# Patient Record
Sex: Female | Born: 1985 | Hispanic: Yes | Marital: Single | Smoking: Never smoker
Health system: Southern US, Community
[De-identification: ages and names within clinical notes are randomized; demographics above are authoritative.]

## PROBLEM LIST (undated history)

## (undated) DIAGNOSIS — Z8619 Personal history of other infectious and parasitic diseases: Secondary | ICD-10-CM

## (undated) DIAGNOSIS — T8859XA Other complications of anesthesia, initial encounter: Secondary | ICD-10-CM

## (undated) DIAGNOSIS — Z9289 Personal history of other medical treatment: Secondary | ICD-10-CM

## (undated) DIAGNOSIS — T4145XA Adverse effect of unspecified anesthetic, initial encounter: Secondary | ICD-10-CM

## (undated) HISTORY — DX: Personal history of other medical treatment: Z92.89

---
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
## 2008-08-03 HISTORY — PX: OVARY SURGERY: SHX727

## 2008-09-25 ENCOUNTER — Ambulatory Visit: Payer: Self-pay

## 2011-08-04 DIAGNOSIS — Z9289 Personal history of other medical treatment: Secondary | ICD-10-CM

## 2011-08-04 HISTORY — DX: Personal history of other medical treatment: Z92.89

## 2015-08-04 NOTE — L&D Delivery Note (Signed)
Deliver Note   Date of Delivery:   01/26/2016 Primary OB:   [facility] Gestational Age/EDD: 5045w5d by 01/28/2016, by Other Basis  Antepartum complications:  OB History    Gravida Para Term Preterm AB TAB SAB Ectopic Multiple Living   1 1 1       0 1      Delivered By:   Vena AustriaStaebler, Alycea Segoviano MD  Delivery Type:   TSVD Anesthesia:     none  Intrapartum complications:  GBS:    Negative Laceration:    1st degree Episiotomy:    none Placenta:    Spontaneous Estimated Blood Loss:  200mL Baby:    Liveborn female delivered at 2015, APGAR (1 MIN): 8   APGAR (5 MINS): 9, weight 7lbs 0 oz  Deliver Details   At 1082015 a female was delivered via  TSVD(Presentation: ROA).  APGAR:  8, 9 ; weight 7lbs 0oz   Placenta status: spontaneous, intact.  Cord: 3VC withoutcomplications: Marland Kitchen.    Mom to postpartum.  Baby to Couplet care / Skin to Skin.

## 2015-08-29 LAB — HM PAP SMEAR

## 2016-01-26 ENCOUNTER — Encounter: Payer: Self-pay | Admitting: *Deleted

## 2016-01-26 ENCOUNTER — Inpatient Hospital Stay
Admission: EM | Admit: 2016-01-26 | Discharge: 2016-01-28 | DRG: 775 | Disposition: A | Discharge status: Home / Self Care | Payer: BLUE CROSS/BLUE SHIELD | Attending: Obstetrics and Gynecology | Admitting: Obstetrics and Gynecology

## 2016-01-26 DIAGNOSIS — Z6834 Body mass index (BMI) 34.0-34.9, adult: Secondary | ICD-10-CM

## 2016-01-26 DIAGNOSIS — Z3A39 39 weeks gestation of pregnancy: Secondary | ICD-10-CM

## 2016-01-26 DIAGNOSIS — O99214 Obesity complicating childbirth: Secondary | ICD-10-CM | POA: Diagnosis present

## 2016-01-26 HISTORY — DX: Other complications of anesthesia, initial encounter: T88.59XA

## 2016-01-26 HISTORY — DX: Adverse effect of unspecified anesthetic, initial encounter: T41.45XA

## 2016-01-26 LAB — CBC
HCT: 36.8 % (ref 35.0–47.0)
HEMOGLOBIN: 12.8 g/dL (ref 12.0–16.0)
MCH: 32.1 pg (ref 26.0–34.0)
MCHC: 34.7 g/dL (ref 32.0–36.0)
MCV: 92.6 fL (ref 80.0–100.0)
Platelets: 257 10*3/uL (ref 150–440)
RBC: 3.97 MIL/uL (ref 3.80–5.20)
RDW: 13.9 % (ref 11.5–14.5)
WBC: 9.5 10*3/uL (ref 3.6–11.0)

## 2016-01-26 LAB — URINE DRUG SCREEN, QUALITATIVE (ARMC ONLY)
Amphetamines, Ur Screen: NOT DETECTED
BARBITURATES, UR SCREEN: NOT DETECTED
BENZODIAZEPINE, UR SCRN: NOT DETECTED
CANNABINOID 50 NG, UR ~~LOC~~: NOT DETECTED
Cocaine Metabolite,Ur ~~LOC~~: NOT DETECTED
MDMA (Ecstasy)Ur Screen: NOT DETECTED
METHADONE SCREEN, URINE: NOT DETECTED
OPIATE, UR SCREEN: NOT DETECTED
Phencyclidine (PCP) Ur S: NOT DETECTED
TRICYCLIC, UR SCREEN: NOT DETECTED

## 2016-01-26 LAB — TYPE AND SCREEN
ABO/RH(D): O POS
Antibody Screen: NEGATIVE

## 2016-01-26 MED ORDER — OXYCODONE-ACETAMINOPHEN 5-325 MG PO TABS
1.0000 | ORAL_TABLET | ORAL | Status: DC | PRN
Start: 2016-01-26 — End: 2016-01-28

## 2016-01-26 MED ORDER — LIDOCAINE HCL (PF) 1 % IJ SOLN
INTRAMUSCULAR | Status: AC
  Filled 2016-01-26: qty 30

## 2016-01-26 MED ORDER — SENNOSIDES-DOCUSATE SODIUM 8.6-50 MG PO TABS
2.0000 | ORAL_TABLET | ORAL | Status: DC
  Administered 2016-01-26 – 2016-01-28 (×2): 2 via ORAL
  Filled 2016-01-26 (×2): qty 2

## 2016-01-26 MED ORDER — SOD CITRATE-CITRIC ACID 500-334 MG/5ML PO SOLN: 30.0000 mL | ORAL | Status: DC | PRN

## 2016-01-26 MED ORDER — PRENATAL MULTIVITAMIN CH
1.0000 | ORAL_TABLET | Freq: Every day | ORAL | Status: DC
  Administered 2016-01-27 – 2016-01-28 (×2): 1 via ORAL
  Filled 2016-01-26 (×2): qty 1

## 2016-01-26 MED ORDER — LACTATED RINGERS IV SOLN
INTRAVENOUS | Status: DC
  Administered 2016-01-26: 18:00:00 via INTRAVENOUS

## 2016-01-26 MED ORDER — IBUPROFEN 600 MG PO TABS
600.0000 mg | ORAL_TABLET | Freq: Four times a day (QID) | ORAL | Status: DC
  Administered 2016-01-26 – 2016-01-28 (×7): 600 mg via ORAL
  Filled 2016-01-26 (×7): qty 1

## 2016-01-26 MED ORDER — FENTANYL 2.5 MCG/ML W/ROPIVACAINE 0.2% IN NS 100 ML EPIDURAL INFUSION (ARMC-ANES)
EPIDURAL | Status: AC
  Filled 2016-01-26: qty 100

## 2016-01-26 MED ORDER — ONDANSETRON HCL 4 MG/2ML IJ SOLN: 4.0000 mg | INTRAMUSCULAR | Status: DC | PRN

## 2016-01-26 MED ORDER — OXYCODONE-ACETAMINOPHEN 5-325 MG PO TABS: 2.0000 | ORAL_TABLET | ORAL | Status: DC | PRN

## 2016-01-26 MED ORDER — OXYTOCIN 10 UNIT/ML IJ SOLN
INTRAMUSCULAR | Status: AC
  Filled 2016-01-26: qty 2

## 2016-01-26 MED ORDER — BENZOCAINE-MENTHOL 20-0.5 % EX AERO
1.0000 "application " | INHALATION_SPRAY | CUTANEOUS | Status: DC | PRN
  Filled 2016-01-26: qty 56

## 2016-01-26 MED ORDER — ACETAMINOPHEN 325 MG PO TABS: 650.0000 mg | ORAL_TABLET | ORAL | Status: DC | PRN

## 2016-01-26 MED ORDER — ONDANSETRON HCL 4 MG/2ML IJ SOLN: 4.0000 mg | Freq: Four times a day (QID) | INTRAMUSCULAR | Status: DC | PRN

## 2016-01-26 MED ORDER — COCONUT OIL OIL: 1.0000 "application " | TOPICAL_OIL | Status: DC | PRN

## 2016-01-26 MED ORDER — DIBUCAINE 1 % RE OINT: 1.0000 "application " | TOPICAL_OINTMENT | RECTAL | Status: DC | PRN

## 2016-01-26 MED ORDER — OXYTOCIN 40 UNITS IN LACTATED RINGERS INFUSION - SIMPLE MED
INTRAVENOUS | Status: AC
  Filled 2016-01-26: qty 1000

## 2016-01-26 MED ORDER — LIDOCAINE HCL (PF) 1 % IJ SOLN: 30.0000 mL | INTRAMUSCULAR | Status: DC | PRN

## 2016-01-26 MED ORDER — AMMONIA AROMATIC IN INHA
RESPIRATORY_TRACT | Status: AC
  Filled 2016-01-26: qty 10

## 2016-01-26 MED ORDER — OXYTOCIN BOLUS FROM INFUSION: 500.0000 mL | INTRAVENOUS | Status: DC

## 2016-01-26 MED ORDER — ONDANSETRON HCL 4 MG PO TABS: 4.0000 mg | ORAL_TABLET | ORAL | Status: DC | PRN

## 2016-01-26 MED ORDER — WITCH HAZEL-GLYCERIN EX PADS: 1.0000 "application " | MEDICATED_PAD | CUTANEOUS | Status: DC | PRN

## 2016-01-26 MED ORDER — BUTORPHANOL TARTRATE 1 MG/ML IJ SOLN: 1.0000 mg | INTRAMUSCULAR | Status: DC | PRN

## 2016-01-26 MED ORDER — ACETAMINOPHEN 325 MG PO TABS
650.0000 mg | ORAL_TABLET | ORAL | Status: DC | PRN
  Administered 2016-01-27: 650 mg via ORAL
  Filled 2016-01-26: qty 2

## 2016-01-26 MED ORDER — SIMETHICONE 80 MG PO CHEW: 80.0000 mg | CHEWABLE_TABLET | ORAL | Status: DC | PRN

## 2016-01-26 MED ORDER — MISOPROSTOL 200 MCG PO TABS
ORAL_TABLET | ORAL | Status: AC
  Filled 2016-01-26: qty 4

## 2016-01-26 MED ORDER — OXYTOCIN 40 UNITS IN LACTATED RINGERS INFUSION - SIMPLE MED: 2.5000 [IU]/h | INTRAVENOUS | Status: DC

## 2016-01-26 MED ORDER — LACTATED RINGERS IV SOLN: 500.0000 mL | INTRAVENOUS | Status: DC | PRN

## 2016-01-26 MED ORDER — DIPHENHYDRAMINE HCL 25 MG PO CAPS: 25.0000 mg | ORAL_CAPSULE | Freq: Four times a day (QID) | ORAL | Status: DC | PRN

## 2016-01-26 NOTE — H&P (Signed)
Obstetric H&P   Chief Complaint: Contractions  Prenatal Care Provider: [facility]  History of Present Illness: 30 y.o. G1P0 1058w5d by 01/28/2016, by LMP=19 week US presenting with contractions,  Last cervical check 01/23/16 1/50/-3.  +FM, no LOF, no VB  PNC noteable for late entry to care, obesity with starting BMI of 31, 21lbs weight gain this pregnancy, and   PNL O pos / ABSC neg / RI / VZI / HBsAg neg / RPR NR / HIV neg / 1-hr 137 / GBS negative  Review of Systems: 10 point review of systems negative unless otherwise noted in HPI  Past Medical History: Past Medical History  Diagnosis Date  . Complication of anesthesia     Past Surgical History: Past Surgical History  Procedure Laterality Date  . Ovary surgery  2010    Past Obstetric History:  Past Gynecologic History:  Family History: No family history on file.  Social History: Social History   Social History  . Marital Status: Single    Spouse Name: N/A  . Number of Children: N/A  . Years of Education: N/A   Occupational History  . Not on file.   Social History Main Topics  . Smoking status: Not on file  . Smokeless tobacco: Not on file  . Alcohol Use: Not on file  . Drug Use: Not on file  . Sexual Activity: Not on file   Other Topics Concern  . Not on file   Social History Narrative  . No narrative on file    Medications: Prior to Admission medications   Not on File    Allergies: No Known Allergies  Physical Exam: Vitals: Blood pressure 124/76, pulse 84, temperature 99.2 F (37.3 C), temperature source Oral, resp. rate 20, height 5\' 2"  (1.575 m), weight 84.823 kg (187 lb).  Urine Dip Protein: N/A  FHT: 140, minimal to moderate variability, no accels, no decels Toco: q52min  General: NAD HEENT: normocephalic, anicteric Pulmonary: no increased work of breathing Abdomen: Gravid, non-tender Leopolds: vtx Genitourinary:  Dilation: 6 Effacement (%): 90 Cervical Position: Anterior Station:  -2 Exam by:: JFS  Extremities: no edema  Labs: No results found for this or any previous visit (from the past 24 hour(s)).  Assessment: 30 y.o. G1P0 7858w5d by 01/28/2016, by LMP=19week US presenting in term labor  Plan: 1) Term labor - expectant management  2) Fetus - category II tracing  3) PNL - PNL O pos / ABSC neg / RI / VZI / HBsAg neg / RPR NR / HIV neg / 1-hr 137 / GBS negative  4) TDAP - received 11/28/2015  5) Postpartum contraception - nexplanon  6) Disposition - pending delivery

## 2016-01-26 NOTE — Discharge Summary (Signed)
Obstetric Discharge Summary Reason for Admission: onset of labor Prenatal Procedures: none Intrapartum Procedures: spontaneous vaginal delivery Postpartum Procedures: none Complications-Operative and Postpartum: none HEMOGLOBIN  Date Value Ref Range Status  01/27/2016 10.9* 12.0 - 16.0 g/dL Final   HCT  Date Value Ref Range Status  01/27/2016 31.6* 35.0 - 47.0 % Final    Physical Exam: BP 115/66 mmHg  Pulse 77  Temp(Src) 97.7 F (36.5 C) (Oral)  Resp 18  Ht 5\' 2"  (1.575 m)  Wt 84.823 kg (187 lb)  BMI 34.19 kg/m2  SpO2 99%  Breastfeeding? Unknown General: alert, appears stated age and no distress Lochia: appropriate Uterine Fundus: firm/ U-2/ ML/ NT DVT Evaluation: No evidence of DVT seen on physical exam.  Discharge Diagnoses: Term Pregnancy-delivered  Discharge Information: Date: 01/28/2016 Activity: pelvic rest Diet: routine   Medication List    TAKE these medications        ibuprofen 600 MG tablet  Commonly known as:  ADVIL,MOTRIN  Take 1 tablet (600 mg total) by mouth every 6 (six) hours as needed for headache, mild pain or moderate pain.     prenatal multivitamin Tabs tablet  Take 1 tablet by mouth daily at 12 noon.        Condition: stable Discharge to: home Follow-up Information    Follow up with Lorrene ReidSTAEBLER, ANDREAS M, MD In 6 weeks.   Specialty:  Obstetrics and Gynecology   Why:  6 week postpartum check   Contact information:   [address] [place] [id number] [phone]       Newborn Data: Live born female / Annaliese Birth Weight: 7 lb 6.5 oz (3360 g) APGAR: 8, 9  Home with mother.  Joshua Soulier 01/28/2016, 8:26 AM

## 2016-01-27 LAB — CBC
HEMATOCRIT: 31.6 % — AB (ref 35.0–47.0)
Hemoglobin: 10.9 g/dL — ABNORMAL LOW (ref 12.0–16.0)
MCH: 32 pg (ref 26.0–34.0)
MCHC: 34.5 g/dL (ref 32.0–36.0)
MCV: 92.6 fL (ref 80.0–100.0)
Platelets: 209 10*3/uL (ref 150–440)
RBC: 3.42 MIL/uL — ABNORMAL LOW (ref 3.80–5.20)
RDW: 14.2 % (ref 11.5–14.5)
WBC: 12.1 10*3/uL — ABNORMAL HIGH (ref 3.6–11.0)

## 2016-01-27 NOTE — Progress Notes (Signed)
  Postpartum Day 1  Subjective: no complaints, up ad lib, voiding and tolerating PO  Objective: Blood pressure 104/50, pulse 75, temperature 98 F (36.7 C), temperature source Oral, resp. rate 20, height 5\' 2"  (1.575 m), weight 187 lb (84.823 kg), SpO2 97 %,  Physical Exam:  General: alert and cooperative Lochia: appropriate Uterine Fundus: firm Incision: N/A DVT Evaluation: No evidence of DVT seen on physical exam. Abdomen: soft, NT   Recent Labs  01/26/16 1816 01/27/16 0617  HGB 12.8 10.9*  HCT 36.8 31.6*    Assessment PPD #1  Plan: Continue PP care and Advance activity as tolerated  Feeding: breast Contraception: Nexplanon Blood Type: O+ RI/VI TDAP UTD    [name], [name], [place] 01/27/2016, 9:09 AM

## 2016-01-28 LAB — RPR: RPR Ser Ql: NONREACTIVE

## 2016-01-28 MED ORDER — PRENATAL MULTIVITAMIN CH: 1.0000 | ORAL_TABLET | Freq: Every day | ORAL | Status: DC

## 2016-01-28 MED ORDER — IBUPROFEN 600 MG PO TABS: 600.0000 mg | ORAL_TABLET | Freq: Four times a day (QID) | ORAL | Status: DC | PRN

## 2016-01-28 NOTE — Progress Notes (Signed)
Discharge instructions given. Patient verbalizes understanding of teaching. Patient discharged home at 1845.

## 2018-05-11 LAB — HM HIV SCREENING LAB: HM HIV SCREENING: NEGATIVE

## 2018-05-13 LAB — HM PAP SMEAR

## 2019-03-04 DIAGNOSIS — Z8616 Personal history of COVID-19: Secondary | ICD-10-CM

## 2019-03-04 HISTORY — DX: Personal history of COVID-19: Z86.16

## 2019-03-22 ENCOUNTER — Other Ambulatory Visit: Payer: Self-pay

## 2019-03-22 ENCOUNTER — Emergency Department
Admission: EM | Admit: 2019-03-22 | Discharge: 2019-03-22 | Disposition: A | Discharge status: Home / Self Care | Payer: BLUE CROSS/BLUE SHIELD | Attending: Emergency Medicine | Admitting: Emergency Medicine

## 2019-03-22 ENCOUNTER — Emergency Department: Payer: BLUE CROSS/BLUE SHIELD

## 2019-03-22 DIAGNOSIS — M7918 Myalgia, other site: Secondary | ICD-10-CM | POA: Diagnosis not present

## 2019-03-22 DIAGNOSIS — U071 COVID-19: Secondary | ICD-10-CM | POA: Diagnosis not present

## 2019-03-22 DIAGNOSIS — J189 Pneumonia, unspecified organism: Secondary | ICD-10-CM | POA: Insufficient documentation

## 2019-03-22 DIAGNOSIS — R51 Headache: Secondary | ICD-10-CM | POA: Diagnosis not present

## 2019-03-22 DIAGNOSIS — R0602 Shortness of breath: Secondary | ICD-10-CM | POA: Insufficient documentation

## 2019-03-22 DIAGNOSIS — R0981 Nasal congestion: Secondary | ICD-10-CM | POA: Diagnosis not present

## 2019-03-22 DIAGNOSIS — R05 Cough: Secondary | ICD-10-CM | POA: Diagnosis present

## 2019-03-22 DIAGNOSIS — Z79899 Other long term (current) drug therapy: Secondary | ICD-10-CM | POA: Insufficient documentation

## 2019-03-22 DIAGNOSIS — R0789 Other chest pain: Secondary | ICD-10-CM | POA: Insufficient documentation

## 2019-03-22 LAB — COMPREHENSIVE METABOLIC PANEL
ALT: 51 U/L — ABNORMAL HIGH (ref 0–44)
AST: 36 U/L (ref 15–41)
Albumin: 3.9 g/dL (ref 3.5–5.0)
Alkaline Phosphatase: 94 U/L (ref 38–126)
Anion gap: 7 (ref 5–15)
BUN: 6 mg/dL (ref 6–20)
CO2: 27 mmol/L (ref 22–32)
Calcium: 8.9 mg/dL (ref 8.9–10.3)
Chloride: 103 mmol/L (ref 98–111)
Creatinine, Ser: 0.54 mg/dL (ref 0.44–1.00)
GFR calc Af Amer: >60 mL/min (ref >60)
GFR calc non Af Amer: >60 mL/min (ref >60)
Glucose, Bld: 95 mg/dL (ref 70–99)
Potassium: 3.6 mmol/L (ref 3.5–5.1)
Sodium: 137 mmol/L (ref 135–145)
Total Bilirubin: 0.9 mg/dL (ref 0.3–1.2)
Total Protein: 7.7 g/dL (ref 6.5–8.1)

## 2019-03-22 LAB — CBC WITH DIFFERENTIAL/PLATELET
Abs Immature Granulocytes: 0.02 10*3/uL (ref 0.00–0.07)
Basophils Absolute: 0 10*3/uL (ref 0.0–0.1)
Basophils Relative: 0 %
Eosinophils Absolute: 0.1 10*3/uL (ref 0.0–0.5)
Eosinophils Relative: 1 %
HCT: 40.9 % (ref 36.0–46.0)
Hemoglobin: 13.9 g/dL (ref 12.0–15.0)
Immature Granulocytes: 0 %
Lymphocytes Relative: 23 %
Lymphs Abs: 1.6 10*3/uL (ref 0.7–4.0)
MCH: 31.3 pg (ref 26.0–34.0)
MCHC: 34 g/dL (ref 30.0–36.0)
MCV: 92.1 fL (ref 80.0–100.0)
Monocytes Absolute: 0.5 10*3/uL (ref 0.1–1.0)
Monocytes Relative: 7 %
Neutro Abs: 4.7 10*3/uL (ref 1.7–7.7)
Neutrophils Relative %: 69 %
Platelets: 262 10*3/uL (ref 150–400)
RBC: 4.44 MIL/uL (ref 3.87–5.11)
RDW: 12.1 % (ref 11.5–15.5)
WBC: 6.9 10*3/uL (ref 4.0–10.5)
nRBC: 0 % (ref 0.0–0.2)

## 2019-03-22 MED ORDER — PREDNISONE 50 MG PO TABS: ORAL_TABLET | ORAL | 0 refills | Status: DC

## 2019-03-22 MED ORDER — AZITHROMYCIN 250 MG PO TABS: ORAL_TABLET | ORAL | 0 refills | Status: AC

## 2019-03-22 NOTE — Discharge Instructions (Signed)
Take azithromycin as directed. Take prednisone once daily for the next 5 days. Take Tylenol and ibuprofen alternating for headache.

## 2019-03-22 NOTE — ED Notes (Signed)
See triage note  Presents with some sore throat and chest discomfort   States her throat became sore couple of days ago  And then pain in chest with cough and inspiration 2 days ago   Was tested positive for COVID

## 2019-03-22 NOTE — ED Provider Notes (Signed)
[facility] Emergency Department Provider Note  ____________________________________________  Time seen: Approximately 3:43 PM  I have reviewed the triage vital signs and the nursing notes.   HISTORY  Chief Complaint Cough and Other (COVID)    HPI Lindsey Aguirre is a 33 y.o. female presents to the emergency department after being diagnosed with COVID-19.  Patient states that she has had cough, some shortness of breath and burning chest discomfort only with coughing.  She also has nasal congestion, headache and body aches.  Her husband has had similar symptoms.  Patient states that she cannot rest due to the cough and is here primarily to access medications to manage cough.  Her past medical history is unremarkable and she takes no medications daily.  She has a 4-year-old daughter in the home who is currently asymptomatic.  No elderly family members in the home.  No other alleviating measures have been attempted.        Past Medical History:  Diagnosis Date  . Complication of anesthesia   . History of blood transfusion 2013    Patient Active Problem List   Diagnosis Date Noted  . Postpartum care following vaginal delivery 01/26/2016    Past Surgical History:  Procedure Laterality Date  . OVARY SURGERY  2010    Prior to Admission medications   Medication Sig Start Date End Date Taking? Authorizing Provider  azithromycin (ZITHROMAX Z-PAK) 250 MG tablet Take 2 tablets (500 mg) on  Day 1,  followed by 1 tablet (250 mg) once daily on Days 2 through 5. 03/22/19 03/27/19  Lannie Fields, PA-C  norgestimate-ethinyl estradiol (ORTHO-CYCLEN,SPRINTEC,PREVIFEM) 0.25-35 MG-MCG tablet Take 1 tablet by mouth daily. 05/11/18 06/03/19  [provider]  predniSONE (DELTASONE) 50 MG tablet Take 1 tablet daily for the next 5 days. 03/22/19   Lannie Fields, PA-C    Allergies Patient has no known allergies.  No family history on file.  Social History Social  History   Tobacco Use  . Smoking status: Never Smoker  Substance Use Topics  . Alcohol use: Not on file  . Drug use: Not on file     Review of Systems  Constitutional: No fever/chills Eyes: No visual changes. No discharge ENT: No upper respiratory complaints. Cardiovascular: no chest pain. Respiratory: Patient has cough and SOB.  Gastrointestinal: No abdominal pain.  No nausea, no vomiting.  No diarrhea.  No constipation. Genitourinary: Negative for dysuria. No hematuria Musculoskeletal: Patient has body aches.  Skin: Negative for rash, abrasions, lacerations, ecchymosis. Neurological: Negative for headaches, focal weakness or numbness.  ____________________________________________   PHYSICAL EXAM:  VITAL SIGNS: ED Triage Vitals [03/22/19 1441]  Enc Vitals Group     BP 120/80     Pulse Rate 75     Resp 16     Temp 98.5 F (36.9 C)     Temp Source Oral     SpO2 97 %     Weight 172 lb (78 kg)     Height 5\' 2"  (1.575 m)     Head Circumference      Peak Flow      Pain Score 0     Pain Loc      Pain Edu?      Excl. in [place]?      Constitutional: Alert and oriented.  Patient is sitting upright.  She is able to speak in complete sentences and does not seem to be short of breath at rest. Eyes: Conjunctivae are normal. PERRL. EOMI.  Head: Atraumatic. ENT:      Nose: No congestion/rhinnorhea.      Mouth/Throat: Mucous membranes are moist.  Neck: No stridor.  No cervical spine tenderness to palpation. Hematological/Lymphatic/Immunilogical: No cervical lymphadenopathy.  Cardiovascular: Normal rate, regular rhythm. Normal S1 and S2.  Good peripheral circulation. Respiratory: Normal respiratory effort without tachypnea or retractions. Lungs CTAB. Good air entry to the bases with no decreased or absent breath sounds. Gastrointestinal: Bowel sounds 4 quadrants. Soft and nontender to palpation. No guarding or rigidity. No palpable masses. No distention. No CVA  tenderness. Musculoskeletal: Full range of motion to all extremities. No gross deformities appreciated. Neurologic:  Normal speech and language. No gross focal neurologic deficits are appreciated.  Skin:  Skin is warm, dry and intact. No rash noted. Psychiatric: Mood and affect are normal. Speech and behavior are normal. Patient exhibits appropriate insight and judgement.   ____________________________________________   LABS (all labs ordered are listed, but only abnormal results are displayed)  Labs Reviewed  COMPREHENSIVE METABOLIC PANEL - Abnormal; Notable for the following components:      Result Value   ALT 51 (*)    All other components within normal limits  CBC WITH DIFFERENTIAL/PLATELET   ____________________________________________  EKG   ____________________________________________  RADIOLOGY I personally viewed and evaluated these images as part of my medical decision making, as well as reviewing the written report by the radiologist.    Dg Chest Port 1 View  Result Date: 03/22/2019 CLINICAL DATA:  Sore throat, chest discomfort, pain with coughing and inspiration beginning 2 days ago, COVID-19 positive EXAM: PORTABLE CHEST 1 VIEW COMPARISON:  Portable exam 1624 hours without priors for comparison FINDINGS: Normal heart size, mediastinal contours, and pulmonary vascularity. Hazy opacity at lateral mid LEFT chest question acute infiltrate. Remaining lungs clear. No pleural effusion or pneumothorax. Osseous structures unremarkable. IMPRESSION: Hazy opacity at lateral LEFT mid lung question acute infiltrate. Electronically Signed   By: Ulyses SouthwardMark  Boles M.D.   On: 03/22/2019 17:14    ____________________________________________    PROCEDURES  Procedure(s) performed:    Procedures    Medications - No data to display   ____________________________________________   INITIAL IMPRESSION / ASSESSMENT AND PLAN / ED COURSE  Pertinent labs & imaging results that were  available during my care of the patient were reviewed by me and considered in my medical decision making (see chart for details).  Review of the [place] CSRS was performed in accordance of the [facility] prior to dispensing any controlled drugs.        Assessment and Plan: COVID-19 Community acquired pneumonia 33 year old female presents to the emergency department with known COVID-19 positive status and worsening cough, shortness of breath and mild chest tightness only with cough.  Patient's vital signs are reassuring at triage.  Patient was sitting upright in exam room and appeared in no apparent distress.  She did not seem short of breath while talking.  She was able to speak in complete sentences.  No adventitious lung sounds were auscultated and patient had positive breath sounds in the lung bases bilaterally.  Differential diagnosis includes COVID-19 versus community-acquired pneumonia.  Basic labs were obtained in the emergency department.  No leukocytosis identified.  Patient had hazy infiltrate visualized of the left lung on chest x-ray.  Patient was discharged with azithromycin and prednisone.  She was given strict return precautions to return to the emergency department with new or worsening symptoms.  All patient questions were answered.  Sherlon HandingMaria Mcconnon was evaluated in Emergency Department  on 03/22/2019 for the symptoms described in the history of present illness. She was evaluated in the context of the global COVID-19 pandemic, which necessitated consideration that the patient might be at risk for infection with the SARS-CoV-2 virus that causes COVID-19. Institutional protocols and algorithms that pertain to the evaluation of patients at risk for COVID-19 are in a state of rapid change based on information released by regulatory bodies including the CDC and federal and state organizations. These policies and algorithms were followed during the patient's care in the  ED.     ____________________________________________  FINAL CLINICAL IMPRESSION(S) / ED DIAGNOSES  Final diagnoses:  COVID-19  Community acquired pneumonia, unspecified laterality      NEW MEDICATIONS STARTED DURING THIS VISIT:  ED Discharge Orders         Ordered    predniSONE (DELTASONE) 50 MG tablet     03/22/19 1730    azithromycin (ZITHROMAX Z-PAK) 250 MG tablet     03/22/19 1730              This chart was dictated using voice recognition software/Dragon. Despite best efforts to proofread, errors can occur which can change the meaning. Any change was purely unintentional.    Orvil FeilWoods, Alissia Lory M, PA-C 03/22/19 1816    Sharman CheekStafford, Phillip, MD 03/22/19 2027

## 2019-03-22 NOTE — ED Triage Notes (Signed)
Reports minimal bloody sputum with coughing, chest congestion and pain with coughing. Pt tested positive for COVID. Pt alert and oriented X4, cooperative, RR even and unlabored, color WNL. Pt in NAD.

## 2019-05-29 ENCOUNTER — Encounter: Payer: Self-pay | Admitting: Emergency Medicine

## 2019-05-29 ENCOUNTER — Emergency Department: Payer: BLUE CROSS/BLUE SHIELD

## 2019-05-29 ENCOUNTER — Emergency Department
Admission: EM | Admit: 2019-05-29 | Discharge: 2019-05-29 | Disposition: A | Discharge status: Home / Self Care | Payer: BLUE CROSS/BLUE SHIELD | Attending: Emergency Medicine | Admitting: Emergency Medicine

## 2019-05-29 ENCOUNTER — Other Ambulatory Visit: Payer: Self-pay

## 2019-05-29 DIAGNOSIS — Z79899 Other long term (current) drug therapy: Secondary | ICD-10-CM | POA: Insufficient documentation

## 2019-05-29 DIAGNOSIS — R064 Hyperventilation: Secondary | ICD-10-CM | POA: Insufficient documentation

## 2019-05-29 DIAGNOSIS — R079 Chest pain, unspecified: Secondary | ICD-10-CM | POA: Diagnosis present

## 2019-05-29 DIAGNOSIS — E876 Hypokalemia: Secondary | ICD-10-CM | POA: Diagnosis not present

## 2019-05-29 LAB — COMPREHENSIVE METABOLIC PANEL
ALT: 24 U/L (ref 0–44)
AST: 29 U/L (ref 15–41)
Albumin: 4.5 g/dL (ref 3.5–5.0)
Alkaline Phosphatase: 91 U/L (ref 38–126)
Anion gap: 15 (ref 5–15)
BUN: 11 mg/dL (ref 6–20)
CO2: 20 mmol/L — ABNORMAL LOW (ref 22–32)
Calcium: 9.1 mg/dL (ref 8.9–10.3)
Chloride: 103 mmol/L (ref 98–111)
Creatinine, Ser: 0.59 mg/dL (ref 0.44–1.00)
GFR calc Af Amer: >60 mL/min (ref >60)
GFR calc non Af Amer: >60 mL/min (ref >60)
Glucose, Bld: 129 mg/dL — ABNORMAL HIGH (ref 70–99)
Potassium: 2.9 mmol/L — ABNORMAL LOW (ref 3.5–5.1)
Sodium: 138 mmol/L (ref 135–145)
Total Bilirubin: 1.3 mg/dL — ABNORMAL HIGH (ref 0.3–1.2)
Total Protein: 8 g/dL (ref 6.5–8.1)

## 2019-05-29 LAB — CBC WITH DIFFERENTIAL/PLATELET
Abs Immature Granulocytes: 0.04 10*3/uL (ref 0.00–0.07)
Basophils Absolute: 0 10*3/uL (ref 0.0–0.1)
Basophils Relative: 0 %
Eosinophils Absolute: 0 10*3/uL (ref 0.0–0.5)
Eosinophils Relative: 0 %
HCT: 41.2 % (ref 36.0–46.0)
Hemoglobin: 14 g/dL (ref 12.0–15.0)
Immature Granulocytes: 0 %
Lymphocytes Relative: 27 %
Lymphs Abs: 3.5 10*3/uL (ref 0.7–4.0)
MCH: 31 pg (ref 26.0–34.0)
MCHC: 34 g/dL (ref 30.0–36.0)
MCV: 91.2 fL (ref 80.0–100.0)
Monocytes Absolute: 0.8 10*3/uL (ref 0.1–1.0)
Monocytes Relative: 6 %
Neutro Abs: 8.5 10*3/uL — ABNORMAL HIGH (ref 1.7–7.7)
Neutrophils Relative %: 67 %
Platelets: 303 10*3/uL (ref 150–400)
RBC: 4.52 MIL/uL (ref 3.87–5.11)
RDW: 12.3 % (ref 11.5–15.5)
WBC: 12.9 10*3/uL — ABNORMAL HIGH (ref 4.0–10.5)
nRBC: 0 % (ref 0.0–0.2)

## 2019-05-29 LAB — HCG, QUANTITATIVE, PREGNANCY: hCG, Beta Chain, Quant, S: <1 m[IU]/mL (ref <5)

## 2019-05-29 LAB — FIBRIN DERIVATIVES D-DIMER (ARMC ONLY): Fibrin derivatives D-dimer (ARMC): 162.31 ng/mL (FEU) (ref 0.00–499.00)

## 2019-05-29 LAB — TROPONIN I (HIGH SENSITIVITY): Troponin I (High Sensitivity): 5 ng/L (ref <18)

## 2019-05-29 MED ORDER — ALUM & MAG HYDROXIDE-SIMETH 200-200-20 MG/5ML PO SUSP
30.0000 mL | Freq: Once | ORAL | Status: AC
Start: 2019-05-29 — End: 2019-05-29
  Administered 2019-05-29: 30 mL via ORAL
  Filled 2019-05-29: qty 30

## 2019-05-29 MED ORDER — POTASSIUM CHLORIDE CRYS ER 20 MEQ PO TBCR
40.0000 meq | EXTENDED_RELEASE_TABLET | Freq: Once | ORAL | Status: AC
  Administered 2019-05-29: 40 meq via ORAL
  Filled 2019-05-29: qty 2

## 2019-05-29 NOTE — ED Notes (Signed)
First RN note: pt picked up out of car, pt reports central chest pain that woke her up from sleep. Pt hyperventilating, states she cannot feel her face or her arms. [place] noted to hands. Attempted to coach pt regarding slowing breathing without success. Pt with syncopal episode attempting to obtain information for registration.

## 2019-05-29 NOTE — ED Provider Notes (Signed)
[facility] Emergency Department Provider Note  ____________________________________________   First MD Initiated Contact with Patient 05/29/19 [phone]     (approximate)  I have reviewed the triage vital signs and the nursing notes.   HISTORY  Chief Complaint Chest Pain and Loss of Consciousness  Level 5 caveat:  history/ROS limited by acute/critical illness and/or willingness/ability to participate in interview.  HPI Lindsey Aguirre is a 33 y.o. female with medical history as listed below who presents by private vehicle for evaluation of acute onset of chest pain and shortness of breath.  She was hyperventilating upon arrival and passed out at least once.  She is able to provide some history although she is still quite upset but she was breathing more comfortably now.  She reports that she was asleep and woke up with chest pain that she is unable to describe except that it is in the middle of her chest.  She developed shortness of breath and was breathing very rapidly.  After that continued for a few minutes she says she started to feel her hands and arms go numb which scared her even more.  Upon arrival to the hospital and in triage she was hyperventilating very rapidly and then passed out and was helped into a wheelchair brought to a room where she was put on a nonrebreather.  When I saw her she had calmed down considerably and was acting sleepy.  She reports that the symptoms were severe and acute in onset while she was asleep and nothing in particular made them better or worse.  She no longer has the numbness and tingling in her arms.  She denies estrogen supplements, has no history of blood clots in the legs of the lungs, no unilateral leg pain or swelling.  She was diagnosed with COVID-19 about 2 months ago but she says that her symptoms have completely resolved.  No recent fever/chills, sore throat, or any other symptoms prior to the onset while she was asleep  tonight.        Past Medical History:  Diagnosis Date   Complication of anesthesia    History of 2019 novel coronavirus disease (COVID-19) 03/2019   August 2020   History of blood transfusion 2013    Patient Active Problem List   Diagnosis Date Noted   Postpartum care following vaginal delivery 01/26/2016    Past Surgical History:  Procedure Laterality Date   OVARY SURGERY  2010    Prior to Admission medications   Medication Sig Start Date End Date Taking? Authorizing Provider  norgestimate-ethinyl estradiol (ORTHO-CYCLEN,SPRINTEC,PREVIFEM) 0.25-35 MG-MCG tablet Take 1 tablet by mouth daily. 05/11/18 06/03/19  [provider]  predniSONE (DELTASONE) 50 MG tablet Take 1 tablet daily for the next 5 days. 03/22/19   Orvil FeilWoods, Jaclyn M, PA-C    Allergies Patient has no known allergies.  History reviewed. No pertinent family history.  Social History Social History   Tobacco Use   Smoking status: Never Smoker   Smokeless tobacco: Never Used  Substance Use Topics   Alcohol use: Not on file   Drug use: Not on file    Review of Systems Level 5 caveat:  history/ROS limited by acute/critical illness and/or willingness/ability to participate in interview.  Constitutional: No fever/chills Eyes: No visual changes. ENT: No sore throat. Cardiovascular: +chest pain. Respiratory: +shortness of breath. Gastrointestinal: No abdominal pain.  No nausea, no vomiting.  No diarrhea.  No constipation. Genitourinary: Negative for dysuria. Musculoskeletal: Negative for neck pain.  Negative  for back pain. Integumentary: Negative for rash. Neurological: Negative for headaches, focal weakness or numbness.   ____________________________________________   PHYSICAL EXAM:  VITAL SIGNS: ED Triage Vitals  Enc Vitals Group     BP 05/29/19 0327 (!) 151/84     Pulse Rate 05/29/19 0327 84     Resp 05/29/19 0327 (!) 21     Temp 05/29/19 0327 97.7 F (36.5 C)     Temp  Source 05/29/19 0327 Oral     SpO2 05/29/19 0327 94 %     Weight 05/29/19 0328 78.9 kg (174 lb)     Height 05/29/19 0328 1.575 m (5\' 2" )     Head Circumference --      Peak Flow --      Pain Score 05/29/19 0327 10     Pain Loc --      Pain Edu? --      Excl. in GC? --     Constitutional: Alert and oriented.  Originally seem to be suffering from a panic attack with hyperventilation, now is more calm and somewhat somnolent. Eyes: Conjunctivae are normal.  Head: Atraumatic. Nose: No congestion/rhinnorhea. Mouth/Throat: Patient is wearing a mask. Neck: No stridor.  No meningeal signs.   Cardiovascular: Normal rate, regular rhythm. Good peripheral circulation. Grossly normal heart sounds.  No reproducible chest wall tenderness. Respiratory: Mild tachypnea but normal respiratory effort with no retractions or accessory muscle usage. Gastrointestinal: Soft and nontender. No distention.  Musculoskeletal: No lower extremity tenderness nor edema. No gross deformities of extremities. Neurologic:  Normal speech and language. No gross focal neurologic deficits are appreciated.  Skin:  Skin is warm, dry and intact. Psychiatric: Initially very anxious, now calm and cooperative.  ____________________________________________   LABS (all labs ordered are listed, but only abnormal results are displayed)  Labs Reviewed  CBC WITH DIFFERENTIAL/PLATELET - Abnormal; Notable for the following components:      Result Value   WBC 12.9 (*)    Neutro Abs 8.5 (*)    All other components within normal limits  COMPREHENSIVE METABOLIC PANEL - Abnormal; Notable for the following components:   Potassium 2.9 (*)    CO2 20 (*)    Glucose, Bld 129 (*)    Total Bilirubin 1.3 (*)    All other components within normal limits  FIBRIN DERIVATIVES D-DIMER ([facility] ONLY)  HCG, QUANTITATIVE, PREGNANCY  POC URINE PREG, ED  TROPONIN I (HIGH SENSITIVITY)   ____________________________________________  EKG  ED ECG  REPORT I, 05/31/19, the attending physician, personally viewed and interpreted this ECG.  Date: 05/29/2019 EKG Time: 3:27 AM Rate: 89 Rhythm: normal sinus rhythm QRS Axis: normal Intervals: normal, borderline QTC prolongation at 490 ms ST/T Wave abnormalities: Non-specific ST segment / T-wave changes, but no clear evidence of acute ischemia. Narrative Interpretation: no definitive evidence of acute ischemia; does not meet STEMI criteria.   ____________________________________________  RADIOLOGY I, 05/31/2019, personally viewed and evaluated these images (plain radiographs) as part of my medical decision making, as well as reviewing the written report by the radiologist.  ED MD interpretation: No acute abnormalities on chest x-ray  Official radiology report(s): Dg Chest Portable 1 View  Result Date: 05/29/2019 CLINICAL DATA:  Shortness of breath and syncope EXAM: PORTABLE CHEST 1 VIEW COMPARISON:  03/22/2019 FINDINGS: The heart size and mediastinal contours are within normal limits. Both lungs are clear. The visualized skeletal structures are unremarkable. IMPRESSION: No active disease. Electronically Signed   By: 03/24/2019 M.D.   On:  05/29/2019 03:59    ____________________________________________   PROCEDURES   Procedure(s) performed (including Critical Care):  Procedures   ____________________________________________   INITIAL IMPRESSION / MDM / ASSESSMENT AND PLAN / ED COURSE  As part of my medical decision making, I reviewed the following data within the electronic MEDICAL RECORD NUMBER Nursing notes reviewed and incorporated, Labs reviewed , EKG interpreted , Old chart reviewed, Radiograph reviewed  and Notes from prior ED visits   Differential diagnosis includes, but is not limited to, panic attack leading to chest pain and hyperventilation, pneumonia, viral illness, PE, ACS, myocarditis/pericarditis.  EKG is reassuring, vital signs are stable including no  tachycardia, CBC is reassuring except for a leukocytosis of 12.9.  Other labs are pending at this time including a D-dimer and a troponin.  Although I think ACS is very unlikely and she has a low HEAR score, I do not have a better explanation for her symptoms and I think a D-dimer could be helpful and ruling out PE although she does have a well score for PE of 0.  She has been previously diagnosed with COVID-19 2 months ago and while reinfection is theoretically possible it seems unlikely given the lack of any other infectious symptoms and her resolution of the symptoms within the last 2 months.  We will continue to monitor and await the results of her lab work.      Clinical Course as of May 28 637  Mon May 29, 2019  1610 No active disease  DG Chest Portable 1 View [CF]  0451 essentially normal CMP except for hypokalemia, will replete with 40 meq PO  Comprehensive metabolic panel(!) [CF]  0451 Normal d-dimer, no indication for CTA chest.  Fibrin derivatives D-dimer [facility]): 162.31 [CF]  9604 The patient feels much better and is comfortable with plan to go home.  I do not think she would benefit from a repeat troponin.  She is reporting some "burning" in her chest and I suspect that she had an episode of acid reflux during the night that scared her and resulted in the chest pain followed by the hyperventilation.  I am giving her a GI cocktail and explained my hypothesis and she and her boyfriend/spouse are comfortable with the plan for discharge and outpatient follow-up.  I gave my usual and customary return precautions.   [CF]    Clinical Course User Index [CF] Loleta Rose, MD     ____________________________________________  FINAL CLINICAL IMPRESSION(S) / ED DIAGNOSES  Final diagnoses:  Chest pain, unspecified type  Hyperventilation  Hypokalemia     MEDICATIONS GIVEN DURING THIS VISIT:  Medications  potassium chloride SA (KLOR-CON) CR tablet 40 mEq (has no administration in time  range)  alum & mag hydroxide-simeth (MAALOX/MYLANTA) 200-200-20 MG/5ML suspension 30 mL (has no administration in time range)     ED Discharge Orders    None      *Please note:  Kailey Esquilin was evaluated in Emergency Department on 05/29/2019 for the symptoms described in the history of present illness. She was evaluated in the context of the global COVID-19 pandemic, which necessitated consideration that the patient might be at risk for infection with the SARS-CoV-2 virus that causes COVID-19. Institutional protocols and algorithms that pertain to the evaluation of patients at risk for COVID-19 are in a state of rapid change based on information released by regulatory bodies including the CDC and federal and state organizations. These policies and algorithms were followed during the patient's care in the ED.  Some ED evaluations and interventions may be delayed as a result of limited staffing during the pandemic.*  Note:  This document was prepared using Dragon voice recognition software and may include unintentional dictation errors.   Hinda Kehr, MD 05/29/19 [phone]

## 2019-05-29 NOTE — ED Notes (Signed)
Patient given resources booklet and this RN discussed [facility] pcp services as well as [facility] mental health services.

## 2019-05-29 NOTE — ED Notes (Signed)
Resting more quietly at this time. 

## 2019-05-29 NOTE — Discharge Instructions (Signed)
Your workup in the Emergency Department today was reassuring.  We did not find any specific abnormalities.  We recommend you drink plenty of fluids, take your regular medications and/or any new ones prescribed today, and follow up with the doctor(s) listed in these documents as recommended.  Return to the Emergency Department if you develop new or worsening symptoms that concern you.  

## 2019-05-29 NOTE — ED Triage Notes (Signed)
Patient to the ED checking in for chest pain was hyperventilating.  April, RN reports patient passed out while attempting to obtain information.

## 2019-09-21 ENCOUNTER — Ambulatory Visit: Payer: Self-pay | Admitting: Obstetrics and Gynecology

## 2019-09-25 ENCOUNTER — Other Ambulatory Visit: Payer: Self-pay

## 2019-09-25 ENCOUNTER — Encounter: Payer: BLUE CROSS/BLUE SHIELD | Admitting: Obstetrics and Gynecology

## 2019-09-25 NOTE — Progress Notes (Signed)
This encounter was created in error - please disregard.

## 2019-09-26 NOTE — Patient Instructions (Signed)
I value your feedback and entrusting us with your care. If you get a [facility] patient survey, I would appreciate you taking the time to let us know about your experience today. Thank you!  As of July 13, 2019, your lab results will be released to your MyChart immediately, before I even have a chance to see them. Please give me time to review them and contact you if there are any abnormalities. Thank you for your patience.  

## 2019-10-28 ENCOUNTER — Ambulatory Visit: Payer: Self-pay | Attending: Internal Medicine

## 2019-10-28 DIAGNOSIS — Z23 Encounter for immunization: Secondary | ICD-10-CM

## 2019-10-28 NOTE — Progress Notes (Signed)
   Covid-19 Vaccination Clinic  Name:  Lindsey Aguirre    MRN: [id number] DOB: 09-04-1985  10/28/2019  [name] was observed post Covid-19 immunization for 15 minutes without incident. She was provided with Vaccine Information Sheet and instruction to access the V-Safe system.   [name] was instructed to call 911 with any severe reactions post vaccine: Marland Kitchen Difficulty breathing  . Swelling of face and throat  . A fast heartbeat  . A bad rash all over body  . Dizziness and weakness   Immunizations Administered    Name Date Dose VIS Date Route   Pfizer COVID-19 Vaccine 10/28/2019 10:17 AM 0.3 mL 07/14/2019 Intramuscular   Manufacturer: [employer], [employer]   Lot: [id number]   NDC: [address]-2

## 2019-11-18 ENCOUNTER — Ambulatory Visit: Payer: Self-pay | Attending: Internal Medicine

## 2019-11-18 DIAGNOSIS — Z23 Encounter for immunization: Secondary | ICD-10-CM

## 2019-11-18 NOTE — Progress Notes (Signed)
   Covid-19 Vaccination Clinic  Name:  Lindsey Aguirre    MRN: [id number] DOB: Sep 09, 1985  11/18/2019  [name] was observed post Covid-19 immunization for 15 minutes   without incident. She was provided with Vaccine Information Sheet and instruction to access the V-Safe system.   [name] was instructed to call 911 with any severe reactions post vaccine: Marland Kitchen Difficulty breathing  . Swelling of face and throat  . A fast heartbeat  . A bad rash all over body  . Dizziness and weakness   Immunizations Administered    Name Date Dose VIS Date Route   Pfizer COVID-19 Vaccine 11/18/2019 10:37 AM 0.3 mL 07/14/2019 Intramuscular   Manufacturer: [employer], [employer]   Lot: [id number]   NDC: [address]-8

## 2020-06-21 IMAGING — DX PORTABLE CHEST - 1 VIEW
1 series · 1 of 1 positions shown · non-contrast
Comparison: Portable exam 0546 hours without priors for comparison

CLINICAL DATA: Sore throat, chest discomfort, pain with coughing
and inspiration beginning 2 days ago, [id number] positive

EXAM:
PORTABLE CHEST 1 VIEW

[chest ap]
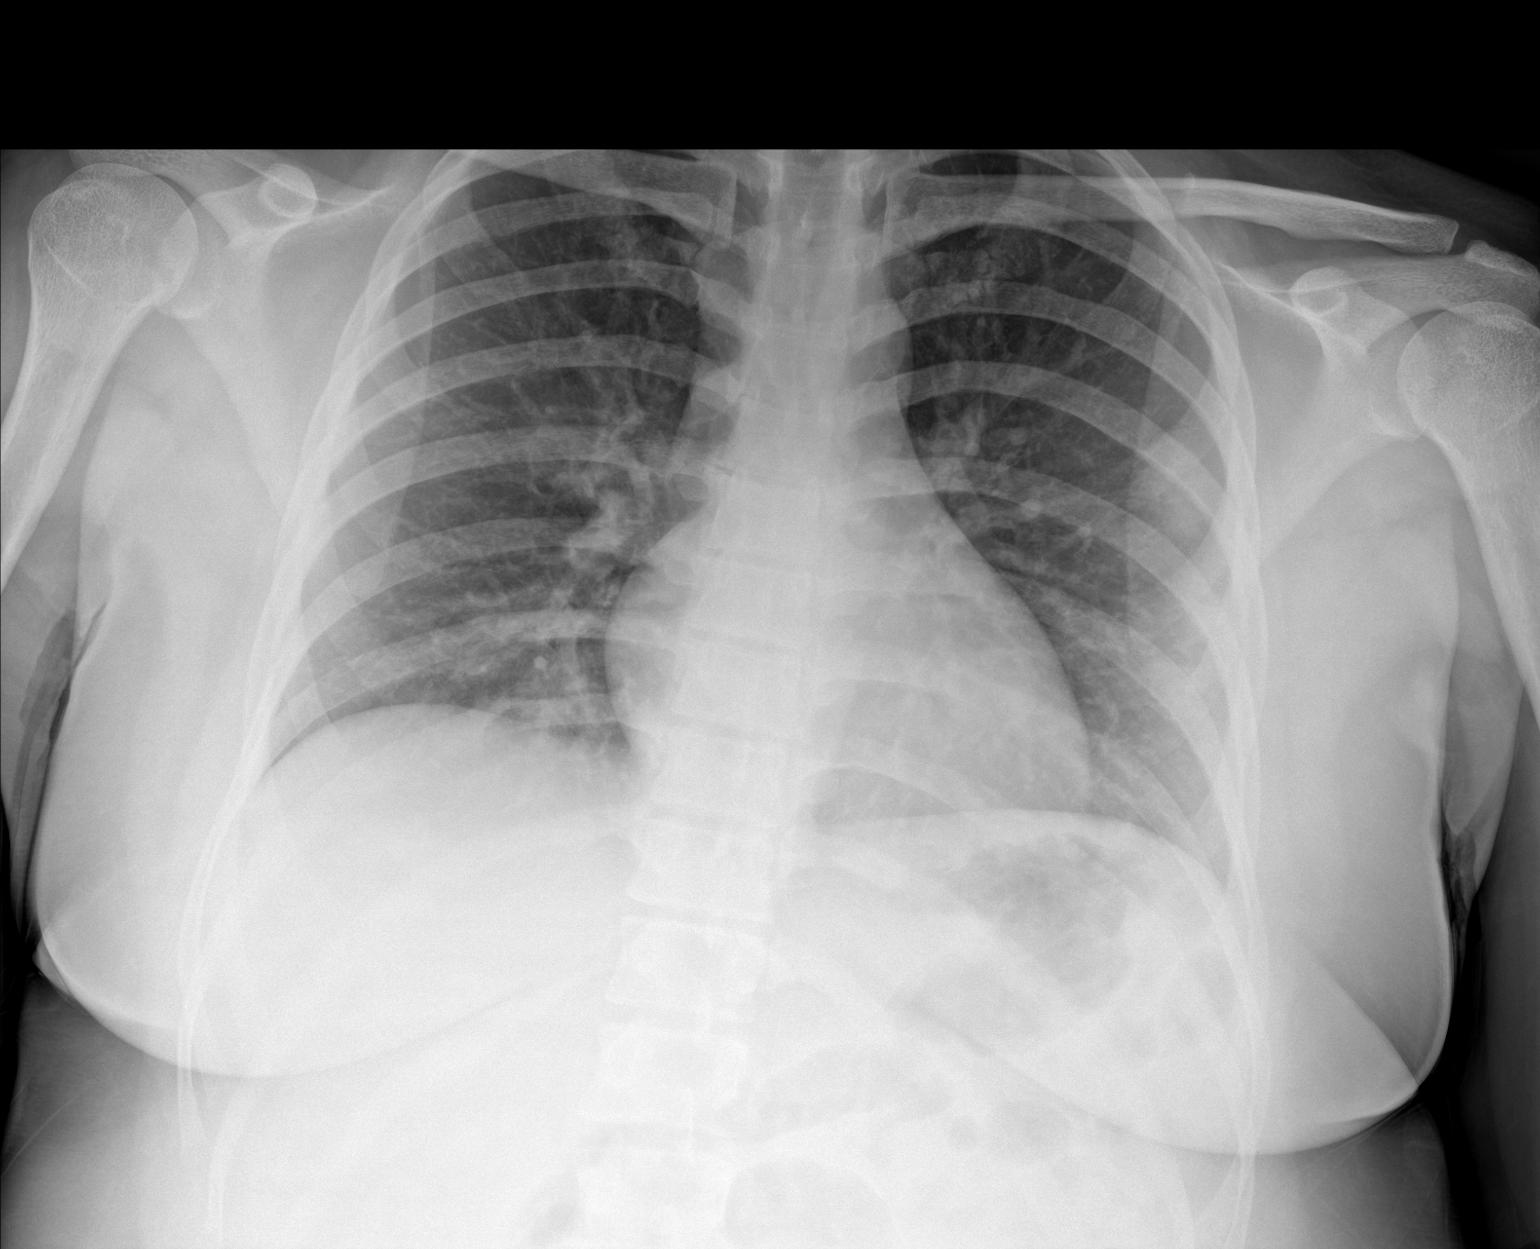

[1 of 1 positions shown; findings below may reference images not displayed]

FINDINGS: Normal heart size, mediastinal contours, and pulmonary vascularity.

Hazy opacity at lateral mid LEFT chest question acute infiltrate.

Remaining lungs clear.

No pleural effusion or pneumothorax.

Osseous structures unremarkable.
IMPRESSION: Hazy opacity at lateral LEFT mid lung question acute infiltrate.

## 2020-08-28 IMAGING — DX DG CHEST 1V PORT
1 series · 1 of 1 positions shown · non-contrast
Comparison: 03/22/2019

CLINICAL DATA: Shortness of breath and syncope

EXAM:
PORTABLE CHEST 1 VIEW

[chest ap]
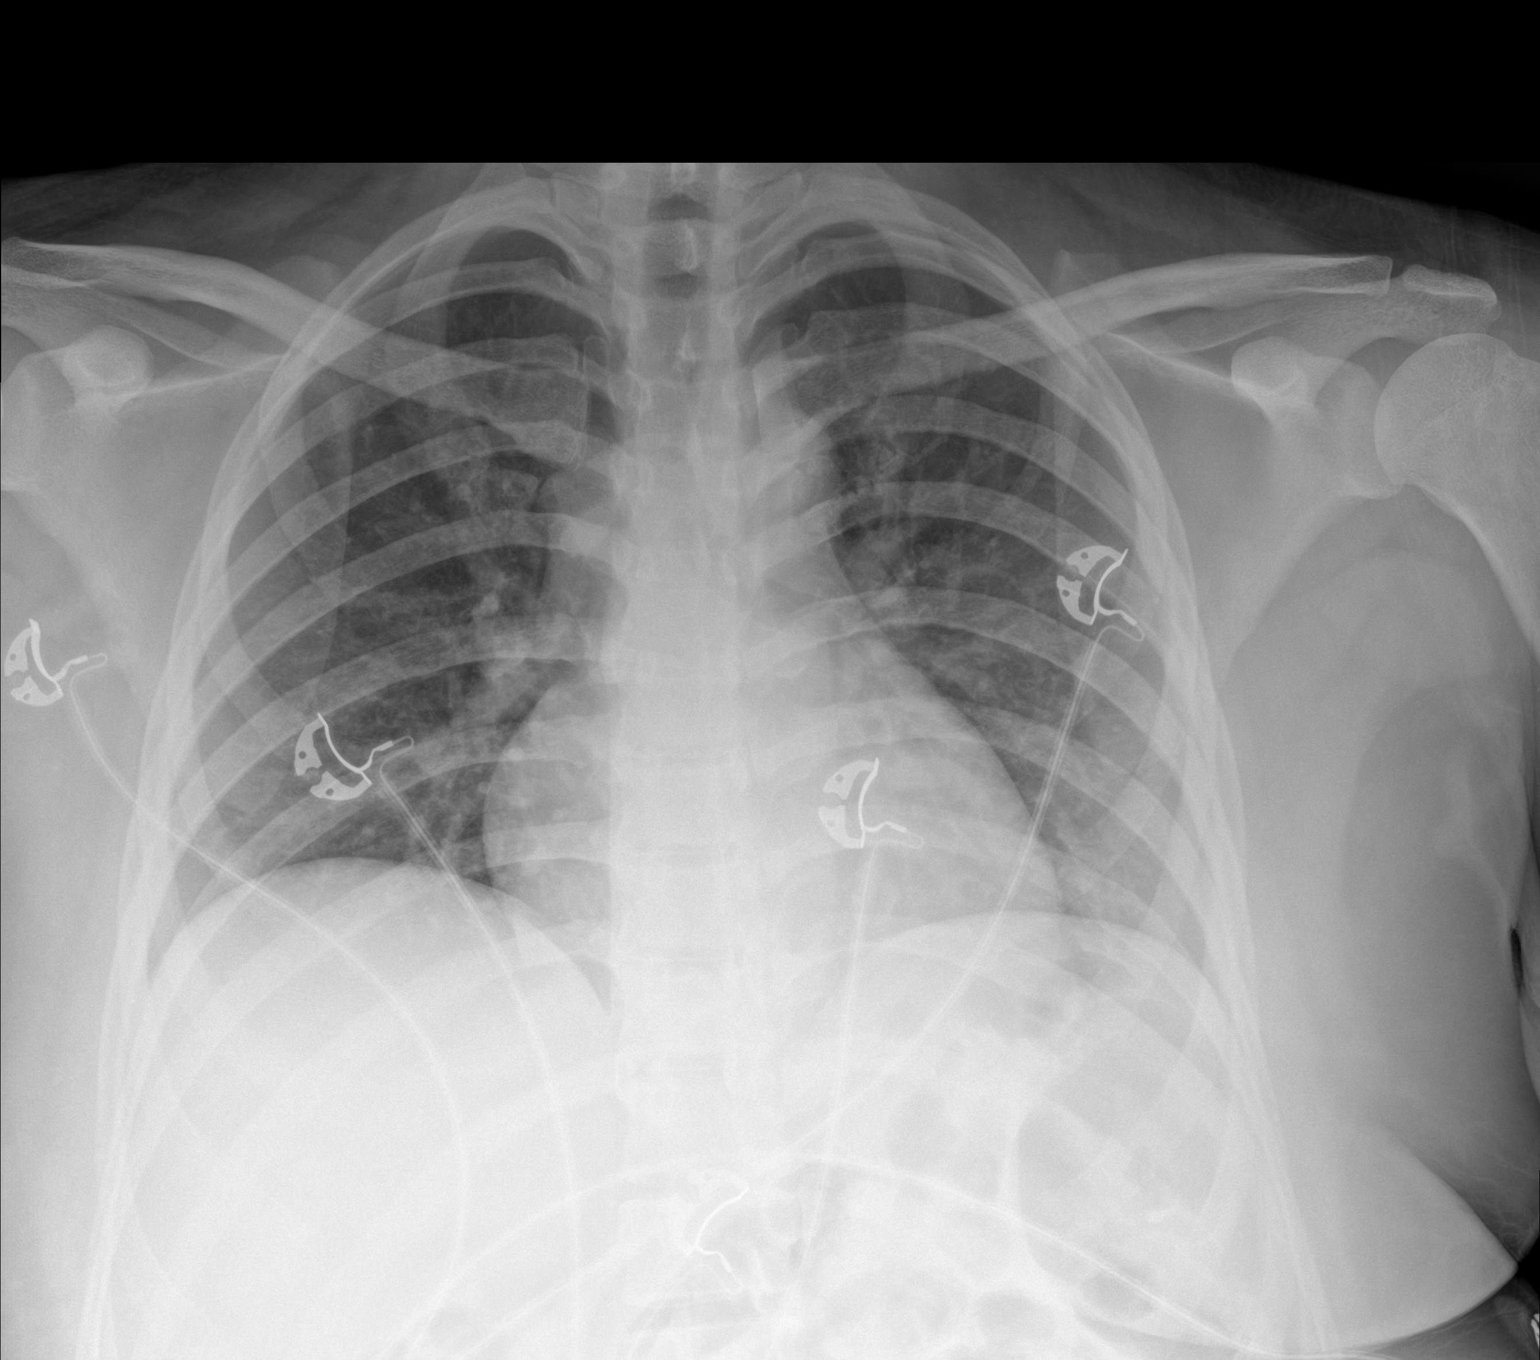

[1 of 1 positions shown; findings below may reference images not displayed]

FINDINGS: The heart size and mediastinal contours are within normal limits.
Both lungs are clear. The visualized skeletal structures are
unremarkable.
IMPRESSION: No active disease.

## 2022-12-09 ENCOUNTER — Encounter: Payer: Self-pay | Admitting: Internal Medicine

## 2022-12-09 ENCOUNTER — Other Ambulatory Visit: Payer: Self-pay | Admitting: Internal Medicine

## 2022-12-09 DIAGNOSIS — M25451 Effusion, right hip: Secondary | ICD-10-CM

## 2022-12-09 DIAGNOSIS — M79604 Pain in right leg: Secondary | ICD-10-CM

## 2022-12-10 ENCOUNTER — Ambulatory Visit: Payer: 59

## 2022-12-10 ENCOUNTER — Ambulatory Visit
Admission: RE | Admit: 2022-12-10 | Discharge: 2022-12-10 | Disposition: A | Discharge status: Home / Self Care | Payer: Commercial Managed Care - HMO | Source: Ambulatory Visit | Attending: Internal Medicine | Admitting: Internal Medicine

## 2022-12-10 DIAGNOSIS — M25451 Effusion, right hip: Secondary | ICD-10-CM | POA: Diagnosis present

## 2022-12-10 DIAGNOSIS — M79604 Pain in right leg: Secondary | ICD-10-CM | POA: Insufficient documentation

## 2022-12-31 ENCOUNTER — Other Ambulatory Visit: Payer: Self-pay

## 2022-12-31 DIAGNOSIS — N979 Female infertility, unspecified: Secondary | ICD-10-CM

## 2023-01-20 ENCOUNTER — Ambulatory Visit: Admission: RE | Admit: 2023-01-20 | Payer: Commercial Managed Care - HMO | Source: Ambulatory Visit

## 2023-03-17 DIAGNOSIS — Z3493 Encounter for supervision of normal pregnancy, unspecified, third trimester: Secondary | ICD-10-CM | POA: Insufficient documentation

## 2023-06-08 LAB — OB RESULTS CONSOLE HEPATITIS B SURFACE ANTIGEN: Hepatitis B Surface Ag: NEGATIVE

## 2023-06-08 LAB — OB RESULTS CONSOLE RUBELLA ANTIBODY, IGM: Rubella: IMMUNE

## 2023-06-08 LAB — HEPATITIS C ANTIBODY: HCV Ab: NEGATIVE

## 2023-06-08 LAB — OB RESULTS CONSOLE GC/CHLAMYDIA
Chlamydia: NEGATIVE
Neisseria Gonorrhea: NEGATIVE

## 2023-06-08 LAB — OB RESULTS CONSOLE VARICELLA ZOSTER ANTIBODY, IGG: Varicella: IMMUNE

## 2023-06-08 LAB — OB RESULTS CONSOLE RPR: RPR: NONREACTIVE

## 2023-06-08 LAB — OB RESULTS CONSOLE HIV ANTIBODY (ROUTINE TESTING): HIV: NONREACTIVE

## 2023-07-08 DIAGNOSIS — O0903 Supervision of pregnancy with history of infertility, third trimester: Secondary | ICD-10-CM | POA: Insufficient documentation

## 2023-07-08 DIAGNOSIS — O99213 Obesity complicating pregnancy, third trimester: Secondary | ICD-10-CM | POA: Insufficient documentation

## 2023-08-04 NOTE — L&D Delivery Note (Signed)
Delivery Note  Lindsey Aguirre is a G2P1001 at [redacted]w[redacted]d, Patient's last menstrual period was 11/29/2022 (exact date)., consistent with [place] at [redacted]w[redacted]d. Estimated Date of Delivery: 09/05/23   First Stage: Labor onset: 0100 Induction: misoprostol Analgesia /Anesthesia intrapartum: None SROM at 0556 GBS: negative  Second Stage: Complete dilation at 0630 Onset of pushing at 0630 FHR second stage 145 bpm with moderate variability, early decels with pushing   Lindsey Aguirre presented to L&D for scheduled IOL d/t elective at term. Misoprostol was used for cervical ripening. She labored well after initial dose and then progressed rapidly after SROM. She had a strong urge to push and was found to be C/C/+2.  She pushed quickly over approximately 10 minutes for a spontaneous vaginal birth.  Delivery of a viable baby girl on 09/06/2023 at 0640 by CNM Delivery of fetal head in OA position with restitution to ROT. Nuchal cord x 1, delivered through;  Anterior then posterior shoulders delivered easily after repositioning from side lying to low fowlers with gentle downward traction. Baby placed on mom's chest, and attended to by baby RN. Poor tone and respiratory effort noted despite stimulation.  Cord double clamped, cut by father of baby. Infant to warmer for further assessment by baby RN.   Cord blood sample collection: Yes O POS  Third Stage: Oxytocin bolus started after delivery of infant for hemorrhage prophylaxis  Placenta delivered via Tomasa Blase mechanism intact with 3 VC @ [id number] Placenta disposition: discarded Uterine tone firm / bleeding small  Laceration: 1st degree perineal - no repair  Anesthesia for repair: None Suture: N/A Est. Blood Loss (mL): 150 ml   Complications: None  Mom to postpartum.  Baby to Couplet care / Skin to Skin.  Newborn: Information for the patient's newborn:  Lindsey, Turay Girl Aguirre [[id number]]  Live born female "Lindsey Aguirre" Birth Weight: 8 lb 12.7 oz (3990 g) APGAR: 3,  9  Newborn Delivery   Birth date/time: 09/06/2023 06:40:00 Delivery type: Vaginal, Spontaneous      Feeding planned: breast feeding  ---------- Margaretmary Eddy, CNM Certified Nurse Midwife [facility]

## 2023-08-16 LAB — OB RESULTS CONSOLE RPR: RPR: NONREACTIVE

## 2023-08-16 LAB — OB RESULTS CONSOLE GC/CHLAMYDIA
Chlamydia: NEGATIVE
Neisseria Gonorrhea: NEGATIVE

## 2023-08-16 LAB — OB RESULTS CONSOLE GBS: GBS: NEGATIVE

## 2023-08-16 LAB — OB RESULTS CONSOLE HIV ANTIBODY (ROUTINE TESTING): HIV: NONREACTIVE

## 2023-09-05 ENCOUNTER — Other Ambulatory Visit: Payer: Self-pay

## 2023-09-05 DIAGNOSIS — O99213 Obesity complicating pregnancy, third trimester: Secondary | ICD-10-CM

## 2023-09-05 DIAGNOSIS — O0903 Supervision of pregnancy with history of infertility, third trimester: Secondary | ICD-10-CM

## 2023-09-05 DIAGNOSIS — O0930 Supervision of pregnancy with insufficient antenatal care, unspecified trimester: Secondary | ICD-10-CM

## 2023-09-05 DIAGNOSIS — Z3490 Encounter for supervision of normal pregnancy, unspecified, unspecified trimester: Secondary | ICD-10-CM

## 2023-09-05 NOTE — Progress Notes (Signed)
G2P1001 at [redacted]w[redacted]d, Patient's last menstrual period was 11/29/2022 (exact date)., c/w 2nd trimester [place] at [redacted]w[redacted]d.  Scheduled for induction of labor for term pregnancy, obesity on 09/06/2023.   Prenatal provider: [facility] Pregnancy complicated by: 1. Encounter for induction of labor   2. Obesity affecting pregnancy in third trimester, unspecified obesity type   3. 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 Late prenatal care      Prenatal Labs: Blood type/Rh O POS  Antibody screen neg  Rubella Immune (11/05 0000)   Varicella Immune (06/08/23)  RPR NR  HBsAg Negative (11/05 0000)   Hep C NR  HIV Non-reactive (01/13 0000)   GC neg  Chlamydia neg  Genetic screening cfDNA negative  1 hour GTT 106  3 hour GTT N/A  GBS Negative/-- (01/13 0000)    Flu: Given prenatally Tdap: Given prenatally RSV: Given prenatally - 07/21/23 Contraception:  TBD Feeding preference: breast feeding  ____ Margaretmary Eddy, CNM Certified Nurse Midwife [facility]

## 2023-09-06 ENCOUNTER — Encounter: Payer: Self-pay | Admitting: Obstetrics and Gynecology

## 2023-09-06 ENCOUNTER — Other Ambulatory Visit: Payer: Self-pay

## 2023-09-06 ENCOUNTER — Inpatient Hospital Stay
Admission: EM | Admit: 2023-09-06 | Discharge: 2023-09-07 | DRG: 807 | Disposition: A | Discharge status: Home / Self Care | Payer: Medicaid Other | Attending: Obstetrics | Admitting: Obstetrics

## 2023-09-06 DIAGNOSIS — Z8616 Personal history of COVID-19: Secondary | ICD-10-CM

## 2023-09-06 DIAGNOSIS — O99214 Obesity complicating childbirth: Secondary | ICD-10-CM | POA: Diagnosis present

## 2023-09-06 DIAGNOSIS — O26893 Other specified pregnancy related conditions, third trimester: Secondary | ICD-10-CM | POA: Diagnosis present

## 2023-09-06 DIAGNOSIS — Z3490 Encounter for supervision of normal pregnancy, unspecified, unspecified trimester: Secondary | ICD-10-CM | POA: Diagnosis present

## 2023-09-06 DIAGNOSIS — Z3A40 40 weeks gestation of pregnancy: Secondary | ICD-10-CM | POA: Diagnosis not present

## 2023-09-06 DIAGNOSIS — O0930 Supervision of pregnancy with insufficient antenatal care, unspecified trimester: Secondary | ICD-10-CM

## 2023-09-06 DIAGNOSIS — Z3483 Encounter for supervision of other normal pregnancy, third trimester: Principal | ICD-10-CM

## 2023-09-06 DIAGNOSIS — O0903 Supervision of pregnancy with history of infertility, third trimester: Secondary | ICD-10-CM

## 2023-09-06 DIAGNOSIS — O99213 Obesity complicating pregnancy, third trimester: Secondary | ICD-10-CM | POA: Diagnosis present

## 2023-09-06 LAB — CBC
HCT: 36.9 % (ref 36.0–46.0)
Hemoglobin: 13.1 g/dL (ref 12.0–15.0)
MCH: 32.8 pg (ref 26.0–34.0)
MCHC: 35.5 g/dL (ref 30.0–36.0)
MCV: 92.3 fL (ref 80.0–100.0)
Platelets: 238 10*3/uL (ref 150–400)
RBC: 4 MIL/uL (ref 3.87–5.11)
RDW: 13.4 % (ref 11.5–15.5)
WBC: 6.9 10*3/uL (ref 4.0–10.5)
nRBC: 0 % (ref 0.0–0.2)

## 2023-09-06 LAB — TYPE AND SCREEN
ABO/RH(D): O POS
Antibody Screen: NEGATIVE

## 2023-09-06 LAB — RPR: RPR Ser Ql: NONREACTIVE

## 2023-09-06 MED ORDER — DIPHENHYDRAMINE HCL 25 MG PO CAPS: 25.0000 mg | ORAL_CAPSULE | Freq: Four times a day (QID) | ORAL | Status: DC | PRN

## 2023-09-06 MED ORDER — SODIUM CHLORIDE 0.9 % IV SOLN: 250.0000 mL | INTRAVENOUS | Status: DC | PRN

## 2023-09-06 MED ORDER — SIMETHICONE 80 MG PO CHEW: 80.0000 mg | CHEWABLE_TABLET | ORAL | Status: DC | PRN

## 2023-09-06 MED ORDER — BENZOCAINE-MENTHOL 20-0.5 % EX AERO: 1.0000 | INHALATION_SPRAY | CUTANEOUS | Status: DC | PRN

## 2023-09-06 MED ORDER — SODIUM CHLORIDE 0.9% FLUSH: 3.0000 mL | Freq: Two times a day (BID) | INTRAVENOUS | Status: DC

## 2023-09-06 MED ORDER — MISOPROSTOL 25 MCG QUARTER TABLET
25.0000 ug | ORAL_TABLET | Freq: Once | ORAL | Status: AC
  Filled 2023-09-06: qty 1

## 2023-09-06 MED ORDER — OXYTOCIN-SODIUM CHLORIDE 30-0.9 UT/500ML-% IV SOLN: 1.0000 m[IU]/min | INTRAVENOUS | Status: DC

## 2023-09-06 MED ORDER — FENTANYL CITRATE (PF) 100 MCG/2ML IJ SOLN: 50.0000 ug | INTRAMUSCULAR | Status: DC | PRN

## 2023-09-06 MED ORDER — ZOLPIDEM TARTRATE 5 MG PO TABS: 5.0000 mg | ORAL_TABLET | Freq: Every evening | ORAL | Status: DC | PRN

## 2023-09-06 MED ORDER — IBUPROFEN 600 MG PO TABS
600.0000 mg | ORAL_TABLET | Freq: Four times a day (QID) | ORAL | Status: DC
  Administered 2023-09-06 (×3): 600 mg via ORAL
  Filled 2023-09-06 (×4): qty 1

## 2023-09-06 MED ORDER — MISOPROSTOL 200 MCG PO TABS
ORAL_TABLET | ORAL | Status: AC
  Administered 2023-09-06: 25 ug via ORAL
  Filled 2023-09-06: qty 4

## 2023-09-06 MED ORDER — AMMONIA AROMATIC IN INHA
RESPIRATORY_TRACT | Status: AC
  Filled 2023-09-06: qty 10

## 2023-09-06 MED ORDER — OXYTOCIN-SODIUM CHLORIDE 30-0.9 UT/500ML-% IV SOLN
2.5000 [IU]/h | INTRAVENOUS | Status: DC
  Administered 2023-09-06: 2.5 [IU]/h via INTRAVENOUS

## 2023-09-06 MED ORDER — SENNOSIDES-DOCUSATE SODIUM 8.6-50 MG PO TABS
2.0000 | ORAL_TABLET | Freq: Every day | ORAL | Status: DC
  Administered 2023-09-07: 2 via ORAL
  Filled 2023-09-06: qty 2

## 2023-09-06 MED ORDER — SOD CITRATE-CITRIC ACID 500-334 MG/5ML PO SOLN: 30.0000 mL | ORAL | Status: DC | PRN

## 2023-09-06 MED ORDER — MISOPROSTOL 25 MCG QUARTER TABLET: 25.0000 ug | ORAL_TABLET | ORAL | Status: DC | PRN

## 2023-09-06 MED ORDER — OXYTOCIN 10 UNIT/ML IJ SOLN
INTRAMUSCULAR | Status: AC
  Filled 2023-09-06: qty 2

## 2023-09-06 MED ORDER — LIDOCAINE HCL (PF) 1 % IJ SOLN
INTRAMUSCULAR | Status: AC
  Filled 2023-09-06: qty 30

## 2023-09-06 MED ORDER — LACTATED RINGERS IV SOLN: INTRAVENOUS | Status: DC

## 2023-09-06 MED ORDER — PRENATAL MULTIVITAMIN CH
1.0000 | ORAL_TABLET | Freq: Every day | ORAL | Status: DC
  Administered 2023-09-06 – 2023-09-07 (×2): 1 via ORAL
  Filled 2023-09-06 (×2): qty 1

## 2023-09-06 MED ORDER — LIDOCAINE HCL (PF) 1 % IJ SOLN: 30.0000 mL | INTRAMUSCULAR | Status: DC | PRN

## 2023-09-06 MED ORDER — ACETAMINOPHEN 500 MG PO TABS: 1000.0000 mg | ORAL_TABLET | Freq: Four times a day (QID) | ORAL | Status: DC | PRN

## 2023-09-06 MED ORDER — ONDANSETRON HCL 4 MG/2ML IJ SOLN: 4.0000 mg | Freq: Four times a day (QID) | INTRAMUSCULAR | Status: DC | PRN

## 2023-09-06 MED ORDER — ACETAMINOPHEN 500 MG PO TABS
1000.0000 mg | ORAL_TABLET | Freq: Four times a day (QID) | ORAL | Status: DC
  Filled 2023-09-06 (×3): qty 2

## 2023-09-06 MED ORDER — ONDANSETRON HCL 4 MG/2ML IJ SOLN: 4.0000 mg | INTRAMUSCULAR | Status: DC | PRN

## 2023-09-06 MED ORDER — COCONUT OIL OIL: 1.0000 | TOPICAL_OIL | Status: DC | PRN

## 2023-09-06 MED ORDER — WITCH HAZEL-GLYCERIN EX PADS: 1.0000 | MEDICATED_PAD | CUTANEOUS | Status: DC | PRN

## 2023-09-06 MED ORDER — CALCIUM CARBONATE ANTACID 500 MG PO CHEW: 400.0000 mg | CHEWABLE_TABLET | Freq: Three times a day (TID) | ORAL | Status: DC | PRN

## 2023-09-06 MED ORDER — DIBUCAINE (PERIANAL) 1 % EX OINT: 1.0000 | TOPICAL_OINTMENT | CUTANEOUS | Status: DC | PRN

## 2023-09-06 MED ORDER — MISOPROSTOL 25 MCG QUARTER TABLET
25.0000 ug | ORAL_TABLET | Freq: Once | ORAL | Status: AC
  Administered 2023-09-06: 25 ug via VAGINAL
  Filled 2023-09-06: qty 1

## 2023-09-06 MED ORDER — FERROUS SULFATE 325 (65 FE) MG PO TABS
325.0000 mg | ORAL_TABLET | Freq: Two times a day (BID) | ORAL | Status: DC
  Administered 2023-09-06: 325 mg via ORAL
  Filled 2023-09-06: qty 1

## 2023-09-06 MED ORDER — SODIUM CHLORIDE 0.9% FLUSH: 3.0000 mL | INTRAVENOUS | Status: DC | PRN

## 2023-09-06 MED ORDER — OXYTOCIN-SODIUM CHLORIDE 30-0.9 UT/500ML-% IV SOLN
INTRAVENOUS | Status: AC
  Administered 2023-09-06: 333 mL via INTRAVENOUS
  Filled 2023-09-06: qty 500

## 2023-09-06 MED ORDER — OXYTOCIN BOLUS FROM INFUSION: 333.0000 mL | Freq: Once | INTRAVENOUS | Status: AC

## 2023-09-06 MED ORDER — ONDANSETRON HCL 4 MG PO TABS: 4.0000 mg | ORAL_TABLET | ORAL | Status: DC | PRN

## 2023-09-06 MED ORDER — TERBUTALINE SULFATE 1 MG/ML IJ SOLN: 0.2500 mg | Freq: Once | INTRAMUSCULAR | Status: DC | PRN

## 2023-09-06 MED ORDER — LACTATED RINGERS IV SOLN: 500.0000 mL | INTRAVENOUS | Status: DC | PRN

## 2023-09-06 NOTE — H&P (Cosign Needed)
OB History & Physical   History of Present Illness:   Chief Complaint: scheduled induction of labor   HPI:  Lindsey Aguirre is a 38 y.o. G2P1001 female at [redacted]w[redacted]d, Patient's last menstrual period was 11/29/2022 (exact date)., consistent with [place] at [redacted]w[redacted]d, with Estimated Date of Delivery: 09/05/23.  She presents to L&D for scheduled IOL at term.  Her pregnancy is complicated by obesity, hx of infertility, pregnancy achieved with Femara, and late prenatal care .  She denies Contractions, Loss of fluid, or Vaginal bleeding prior to admission. Reports regular contractions now. Endorses fetal movement as active.   Reports active fetal movement  Contractions: every 2 to 4 minutes since misoprostol LOF/SROM: denies  Vaginal bleeding: denies   Factors complicating pregnancy:  Principal Problem:   Encounter for induction of labor Active Problems:   Obesity affecting pregnancy in third trimester   Pregnancy with history of infertility in third trimester   Late prenatal care    Prenatal care site:  [facility]  Patient Active Problem List   Diagnosis Date Noted   Encounter for induction of labor 09/06/2023   Late prenatal care 09/06/2023   Obesity affecting pregnancy in third trimester 07/08/2023   Pregnancy with history of infertility in third trimester 07/08/2023   Encounter for supervision of normal pregnancy in third trimester 03/17/2023    Prenatal Transfer Tool  Maternal Diabetes: No Genetic Screening: Normal Maternal Ultrasounds/Referrals: Normal Fetal Ultrasounds or other Referrals:  None Maternal Substance Abuse:  No Significant Maternal Medications:  None Significant Maternal Lab Results: Group B Strep negative  Maternal Medical History:   Past Medical History:  Diagnosis Date   Complication of anesthesia    History of 2019 novel coronavirus disease (COVID-19) 03/2019   August 2020   History of blood transfusion 2013    Past Surgical History:  Procedure  Laterality Date   OVARY SURGERY  2010    No Known Allergies  Prior to Admission medications   Medication Sig Start Date End Date Taking? Authorizing Provider  Ferrous Sulfate (IRON PO) Take 1 tablet by mouth daily.   Yes [provider]    OB History  Gravida Para Term Preterm AB Living  2 1 1  0 0 1  SAB IAB Ectopic Multiple Live Births  0 0 0 0 1    # Outcome Date GA Lbr Len/2nd Weight Sex Type Anes PTL Lv  2 Current           1 Term 01/26/16 [redacted]w[redacted]d 01:40 / 00:38 3360 g F Vag-Spont None  LIV     Birth Comments: N/A     Name: Beebe,GIRL Doneen     Apgar1: 9  Apgar5: 9     Social History: She  reports that she has never smoked. She has never used smokeless tobacco.  Family History: family history is not on file.   Review of Systems: A full review of systems was performed and negative except as noted in the HPI.     Physical Exam:  Vital Signs: BP 136/78 (BP Location: Left Arm)   Pulse 72   Temp 98 F (36.7 C) (Oral)   Resp 14   Ht 5\' 2"  (1.575 m)   Wt 95.3 kg   LMP 11/29/2022 (Exact Date)   BMI 38.41 kg/m   General: no acute distress.  HEENT: normocephalic, atraumatic Heart: regular rate & rhythm Lungs: normal respiratory effort Abdomen: soft, gravid, non-tender;  EFW: 7 lbs  Pelvic:   External: Normal external female  genitalia  Cervix: 4/70/-2 (initially cl/th/-3 on admission)   Extremities: non-tender, symmetric, no edema bilaterally.  DTRs: 2+/2+  Neurologic: Alert & oriented x 3.    Results for orders placed or performed during the hospital encounter of 09/06/23 (from the past 24 hours)  CBC     Status: None   Collection Time: 09/06/23 12:36 AM  Result Value Ref Range   WBC 6.9 4.0 - 10.5 K/uL   RBC 4.00 3.87 - 5.11 MIL/uL   Hemoglobin 13.1 12.0 - 15.0 g/dL   HCT 53.6 64.4 - 03.4 %   MCV 92.3 80.0 - 100.0 fL   MCH 32.8 26.0 - 34.0 pg   MCHC 35.5 30.0 - 36.0 g/dL   RDW 74.2 59.5 - 63.8 %   Platelets 238 150 - 400 K/uL   nRBC 0.0 0.0 -  0.2 %  Type and screen     Status: None   Collection Time: 09/06/23 12:36 AM  Result Value Ref Range   ABO/RH(D) O POS    Antibody Screen NEG    Sample Expiration      09/09/2023,2359 Performed at [facility], [address] [place], [place] [address]     Pertinent Results:  Prenatal Labs: Blood type/Rh O POS   Antibody screen Negative    Rubella Immune (11/05 0000)   Varicella Immune  RPR Nonreactive (01/13 0000)   HBsAg Negative (11/05 0000)  Hep C NR   HIV Non-reactive (01/13 0000)   GC neg  Chlamydia neg  Genetic screening cfDNA negative   1 hour GTT 106  3 hour GTT N/A  GBS Negative/-- (01/13 0000)    FHT:  FHR: 150 bpm, variability: moderate,  accelerations:  Present,  decelerations:  Absent Category/reactivity:  Category I UC:   regular, every 2-4 minutes   Cephalic by Leopolds and SVE   No results found.  Assessment:  Sameen Leas is a 38 y.o. G95P1001 female at [redacted]w[redacted]d with induction at term.   Plan:  1. Admit to Labor & Delivery - Admission status: Inpatient - Dr Beverly Gust MD notified of admission and plan of care  - Reason for admission: induction of labor - consents reviewed and obtained  2. Fetal Well being  - Fetal Tracing: cat 1 - Group B Streptococcus ppx not indicated: GBS negative - Presentation: cephalic confirmed by SVE   3. Routine OB: - Prenatal labs reviewed, as above - Rh positive - CBC, T&S, RPR on admit - Regular diet, saline lock  4. Monitoring of labor  - Contractions monitored with external toco - Pelvis proven to 3360 grams, adequate for trial of labor  - Plan for induction with misoprostol - received 1 dose of oral and vaginal misoprostol. Contracting well now and good cervical change. - Augmentation with oxytocin and AROM as appropriate  - Plan for  continuous fetal monitoring - Maternal pain control as desired; planning unmedicated labor support options  - Anticipate vaginal delivery  5. Post Partum  Planning: - Infant feeding: breast feeding - Contraception:  TBD - Flu vaccine: Given prenatally - Tdap vaccine: Given prenatally - RSV vaccine: Given during pregnancy >/=14 days ago - 07/21/2023   Margaretmary Eddy, CNM Certified Nurse Midwife [facility]

## 2023-09-06 NOTE — Progress Notes (Signed)
Pt arrived for her 0000 induction. Pt reports no regular contractions but has noticed abdominal tightening since yesterday (pain 3/10). Pt reports no leaking of fluid, active vaginal bleeding, and reports positive fetal movement. Toco and EFM placed. VSS. Initial SVE closed/thick/-3. cytotec given vaginally and orally per order. Pt comfortable with husband at bedside.  Laiken Nohr B. Litisha Guagliardo, RN BSN 09/06/2023 1:05 AM

## 2023-09-06 NOTE — Lactation Note (Signed)
This note was copied from a baby's chart. Lactation Consultation Note  Patient Name: Lindsey Aguirre RUEAV'W Date: 09/06/2023 Age:37 hours Reason for consult: Initial assessment;1st time breastfeeding;Term  Maternal Data Has patient been taught Hand Expression?: Yes Does the patient have breastfeeding experience prior to this delivery?: No  Initial assessment with P2 patient with no BF experience and term infant at 5 hours of life. SVD, patient with history of infertility and obesity.   Feeding Mother's Current Feeding Choice: Breast Milk and Formula (Infant receiving glucose checks) Nipple Type: Slow - flow  Patient verbalized wanting to BF upon admission, but has been supplementing with formula due to low infant blood sugar. Patient reported infant previously latched to left breast for ~20 minutes 4 hours prior and ~5 minutes 3 hours prior. Patient reported challenges latching infant to right breast due to smaller nipple.   Interventions Interventions: Breast feeding basics reviewed;Skin to skin;Education  Upon entry, patient was in bed with infant swaddled in bassinet. Patient verbalized the nurses would soon check infant blood sugar, LC recommended and assisted with placing infant STS. Patient and infant remained STS with a blanket covering them for the remainder of the visit and upon LC exit.  LC reviewed hand expression, feeding and breast stimulation frequency, breastmilk expectations, stomach size, infant feeding and satiety cues, and output expectations. Both patient and FOB verbalized understanding. No other concerns were reported and all questions were answered.  Discharge WIC Program: No  Consult Status Consult Status: Follow-up Date: 09/06/23 Follow-up type: In-patient  Consultation and note completed by: Dionisio Paschal, BS, BA, Lactation Student  Yvette Rack Contrell Ballentine 09/06/2023, 12:06 PM

## 2023-09-06 NOTE — Lactation Note (Signed)
This note was copied from a baby's chart. Lactation Consultation Note  Patient Name: Lindsey Aguirre JYNWG'N Date: 09/06/2023 Age:37 hours Reason for consult: Follow-up assessment;1st time breastfeeding;Term;RN request   Maternal Data Follow up assessment w/ a 10hr old baby Lindsey.  RN called lactation to assist w/ a feeding.  Infant had not eaten since 1300.   Feeding Mother's Current Feeding Choice: Breast Milk and Formula  Multiple attempts were made to get infant to sustain a latch at the breast. Infant opens mouth big, grabs breast, suckles twice and releases.  This attempt went on for several minutes. Infant never sustaining the latch.    LC attempted to get infant to suck on her finger but infant was unable to sustain a suck with pinkie finger.  LC provided infant with a 10ml bottle of formula.  Infant continued to have trouble latching to the bottle but after several attempts finally suckled and ate 10ml.    LATCH Score Latch: Repeated attempts needed to sustain latch, nipple held in mouth throughout feeding, stimulation needed to elicit sucking reflex.  Audible Swallowing: None  Type of Nipple: Everted at rest and after stimulation  Comfort (Breast/Nipple): Soft / non-tender  Hold (Positioning): Assistance needed to correctly position infant at breast and maintain latch.  LATCH Score: 6   Lactation Tools Discussed/Used Tools: Pump Breast pump type: Manual Pump Education: Setup, frequency, and cleaning Reason for Pumping: Stimulate the breast when infant does not go to the breast.  Interventions Interventions: Breast feeding basics reviewed;Assisted with latch;Breast massage;Hand express;Adjust position;Support pillows;Position options;Hand pump;Education  LC encouraged patient to offer infant the breast and to practice on positioning.  Infant is very eager at the breast but struggles to maintain the latch.  Patient was provided with a Medela Harmony Pump. Education  was provided on how to use pump and clean pump.   Medicaid Breastfeeding Support handout was provided with several numbers to call to obtain a breastpump.  Consult Status Consult Status: Follow-up Date: 09/07/23 Follow-up type: In-patient    Lindsey Aguirre 09/06/2023, 5:24 PM

## 2023-09-06 NOTE — Discharge Instructions (Signed)
Vaginal Delivery, Care After Refer to this sheet in the next few weeks. These discharge instructions provide you with information on caring for yourself after delivery. Your caregiver may also give you specific instructions. Your treatment has been planned according to the most current medical practices available, but problems sometimes occur. Call your caregiver if you have any problems or questions after you go home. HOME CARE INSTRUCTIONS Take over-the-counter or prescription medicines only as directed by your caregiver or pharmacist. Do not drink alcohol, especially if you are breastfeeding or taking medicine to relieve pain. Do not smoke tobacco. Continue to use good perineal care. Good perineal care includes: Wiping your perineum from back to front Keeping your perineum clean. You can do sitz baths twice a day, to help keep this area clean Do not use tampons, douche or have sex until your caregiver says it is okay. Shower only and avoid sitting in submerged water, aside from sitz baths Wear a well-fitting bra that provides breast support. Eat healthy foods. Drink enough fluids to keep your urine clear or pale yellow. Eat high-fiber foods such as whole grain cereals and breads, brown rice, beans, and fresh fruits and vegetables every day. These foods may help prevent or relieve constipation. Avoid constipation with high fiber foods or medications, such as miralax or metamucil Follow your caregiver's recommendations regarding resumption of activities such as climbing stairs, driving, lifting, exercising, or traveling. Talk to your caregiver about resuming sexual activities. Resumption of sexual activities is dependent upon your risk of infection, your rate of healing, and your comfort and desire to resume sexual activity. Try to have someone help you with your household activities and your newborn for at least a few days after you leave the hospital. Rest as much as possible. Try to rest or  take a nap when your newborn is sleeping. Increase your activities gradually. Keep all of your scheduled postpartum appointments. It is very important to keep your scheduled follow-up appointments. At these appointments, your caregiver will be checking to make sure that you are healing physically and emotionally. SEEK MEDICAL CARE IF:  You are passing large clots from your vagina. Save any clots to show your caregiver. You have a foul smelling discharge from your vagina. You have trouble urinating. You are urinating frequently. You have pain when you urinate. You have a change in your bowel movements. You have increasing redness, pain, or swelling near your vaginal incision (episiotomy) or vaginal tear. You have pus draining from your episiotomy or vaginal tear. Your episiotomy or vaginal tear is separating. You have painful, hard, or reddened breasts. You have a severe headache. You have blurred vision or see spots. You feel sad or depressed. You have thoughts of hurting yourself or your newborn. You have questions about your care, the care of your newborn, or medicines. You are dizzy or light-headed. You have a rash. You have nausea or vomiting. You were breastfeeding and have not had a menstrual period within 12 weeks after you stopped breastfeeding. You are not breastfeeding and have not had a menstrual period by the 12th week after delivery. You have a fever. SEEK IMMEDIATE MEDICAL CARE IF:  You have persistent pain. You have chest pain. You have shortness of breath. You faint. You have leg pain. You have stomach pain. Your vaginal bleeding saturates two or more sanitary pads in 1 hour. MAKE SURE YOU:  Understand these instructions. Will watch your condition. Will get help right away if you are not doing well or  get worse. Document Released: 07/17/2000 Document Revised: 12/04/2013 Document Reviewed: 03/16/2012 [facility] Patient Information 2015 [place], [place]. This  information is not intended to replace advice given to you by your health care provider. Make sure you discuss any questions you have with your health care provider.  Sitz Bath A sitz bath is a warm water bath taken in the sitting position. The water covers only the hips and butt (buttocks). We recommend using one that fits in the toilet, to help with ease of use and cleanliness. It may be used for either healing or cleaning purposes. Sitz baths are also used to relieve pain, itching, or muscle tightening (spasms). The water may contain medicine. Moist heat will help you heal and relax.  HOME CARE  Take 3 to 4 sitz baths a day. Fill the bathtub half-full with warm water. Sit in the water and open the drain a little. Turn on the warm water to keep the tub half-full. Keep the water running constantly. Soak in the water for 15 to 20 minutes. After the sitz bath, pat the affected area dry. GET HELP RIGHT AWAY IF: You get worse instead of better. Stop the sitz baths if you get worse. MAKE SURE YOU: Understand these instructions. Will watch your condition. Will get help right away if you are not doing well or get worse. Document Released: 08/27/2004 Document Revised: 04/13/2012 Document Reviewed: 11/17/2010 [facility] Patient Information 2015 [place], [place]. This information is not intended to replace advice given to you by your health care provider. Make sure you discuss any questions you have with your health care provider.  Make follow up appointment for 6 weeks.

## 2023-09-06 NOTE — Progress Notes (Signed)
L&D Note    Subjective:  Feeling more intense contractions, water broke, breathing well with contractions, partner supportive at bedside   Objective:   Vitals:   09/06/23 0023 09/06/23 0037 09/06/23 0403  BP: 129/78  136/78  Pulse: 84  72  Resp: 16  14  Temp: 98.1 F (36.7 C)  98 F (36.7 C)  TempSrc: Oral  Oral  Weight:  95.3 kg   Height:  5\' 2"  (1.575 m)     Current Vital Signs 24h Vital Sign Ranges  T 98 F (36.7 C) Temp  Avg: 98.1 F (36.7 C)  Min: 98 F (36.7 C)  Max: 98.1 F (36.7 C)  BP 136/78 BP  Min: 129/78  Max: 136/78  HR 72 Pulse  Avg: 78  Min: 72  Max: 84  RR 14 Resp  Avg: 15  Min: 14  Max: 16  SaO2     No data recorded      Gen: alert, cooperative, no distress FHR: Baseline: 145 bpm, Variability: moderate, Accels: Present, Decels: early Toco: regular, every 2-3 minutes SVE: Dilation: 9 Effacement (%): 100 Cervical Position: Middle Station: 0 Presentation: Vertex Exam by:: D Long, RN  Medications SCHEDULED MEDICATIONS   ammonia       lidocaine (PF)       oxytocin       oxytocin 40 units in LR 1000 mL  333 mL Intravenous Once   Oxytocin-Sodium Chloride       sodium chloride flush  3 mL Intravenous Q12H    MEDICATION INFUSIONS   sodium chloride     lactated ringers     lactated ringers Stopped (09/06/23 0121)   oxytocin     oxytocin      PRN MEDICATIONS  sodium chloride, acetaminophen, ammonia, calcium carbonate, fentaNYL (SUBLIMAZE) injection, lactated ringers, lidocaine (PF), lidocaine (PF), misoprostol, ondansetron, oxytocin, Oxytocin-Sodium Chloride, sodium chloride flush, sodium citrate-citric acid, terbutaline   Assessment & Plan:  38 y.o. G2P1001 at [redacted]w[redacted]d admitted for term pregnancy -Labor: Active phase labor. -Fetal Well-being: Category I -GBS: negative -Membranes ruptured, clear fluid, spontaneously at 0556 -Continue present management. -Analgesia: unmedicated labor support options    Gustavo Lah, [place]  09/06/2023 6:09 AM   [facility]

## 2023-09-07 LAB — CBC
HCT: 34.4 % — ABNORMAL LOW (ref 36.0–46.0)
Hemoglobin: 11.9 g/dL — ABNORMAL LOW (ref 12.0–15.0)
MCH: 32.7 pg (ref 26.0–34.0)
MCHC: 34.6 g/dL (ref 30.0–36.0)
MCV: 94.5 fL (ref 80.0–100.0)
Platelets: 207 10*3/uL (ref 150–400)
RBC: 3.64 MIL/uL — ABNORMAL LOW (ref 3.87–5.11)
RDW: 13.5 % (ref 11.5–15.5)
WBC: 7.1 10*3/uL (ref 4.0–10.5)
nRBC: 0 % (ref 0.0–0.2)

## 2023-09-07 NOTE — Discharge Summary (Signed)
 Postpartum Discharge Summary  Patient Name: Lindsey Aguirre DOB: March 18, 1986 MRN: [id number]  Date of admission: 09/06/2023 Delivery date:09/06/2023 Delivering provider: MACKIE, ANNA M Date of discharge: 09/07/2023  Primary OB: [facility] OFE:Ejupzwu'd last menstrual period was 11/29/2022 (exact date). EDC Estimated Date of Delivery: 09/05/23 Gestational Age at Delivery: [redacted]w[redacted]d   Admitting diagnosis: Encounter for induction of labor [Z34.90] Intrauterine pregnancy: [redacted]w[redacted]d     Secondary diagnosis:   Principal Problem:   SVD (spontaneous vaginal delivery) Active Problems:   Obesity affecting pregnancy in third trimester   Pregnancy with history of infertility in third trimester   Encounter for induction of labor   Late prenatal care   Discharge Diagnosis: Term Pregnancy Delivered      Hospital course: Induction of Labor With Vaginal Delivery   38 y.o. yo G2P2002 at [redacted]w[redacted]d was admitted to the hospital 09/06/2023 for induction of labor.  Indication for induction: Elective.  Patient had an labor course complicated by none. Membrane Rupture Time/Date: 5:56 AM,09/06/2023  Delivery Method:Vaginal, Spontaneous Operative Delivery:N/A Episiotomy: None Lacerations:  1st degree Details of delivery can be found in separate delivery note.  Patient had a postpartum course complicated by none. Patient is discharged home 09/07/23.  Newborn Data: Birth date:09/06/2023 Birth time:6:40 AM Gender:Female Living status:Living Apgars:3 ,9  Weight:3990 g                                            Post partum procedures: none Induction:: Cytotec  Complications: None Delivery Type: spontaneous vaginal delivery Anesthesia: non-pharmacological methods Placenta: spontaneous To Pathology: No   Prenatal Labs:  Blood type/Rh O POS   Antibody screen Negative    Rubella Immune (11/05 0000)   Varicella Immune  RPR Nonreactive (01/13 0000)   HBsAg Negative (11/05 0000)  Hep C NR   HIV Non-reactive  (01/13 0000)   GC neg  Chlamydia neg  Genetic screening cfDNA negative   1 hour GTT 106  3 hour GTT N/A  GBS Negative/-- (01/13 0000)     Magnesium Sulfate received: No BMZ received: No Rhophylac:was not indicated MMR: was not indicated Varivax vaccine given: was not indicated T-DaP:Given prenatally Flu: Given prenatally  Transfusion:No  Physical exam  Vitals:   09/06/23 1638 09/06/23 1945 09/06/23 2316 09/07/23 0746  BP: 122/74 116/71 115/75 114/76  Pulse: 66 84 81 68  Resp: 18 18 18 18   Temp: 98.5 F (36.9 C) 98.1 F (36.7 C) 98 F (36.7 C) 97.6 F (36.4 C)  TempSrc: Oral  Oral Oral  SpO2:  97% 95% 96%  Weight:      Height:       General: alert, cooperative, and no distress Lochia: appropriate Uterine Fundus: firm Perineum:minimal edema/laceration hemostatic Incision: n/a DVT Evaluation: No evidence of DVT seen on physical exam.  Labs: Lab Results  Component Value Date   WBC 7.1 09/07/2023   HGB 11.9 (L) 09/07/2023   HCT 34.4 (L) 09/07/2023   MCV 94.5 09/07/2023   PLT 207 09/07/2023      Latest Ref Rng & Units 05/29/2019    3:29 AM  CMP  Glucose 70 - 99 mg/dL 870   BUN 6 - 20 mg/dL 11   Creatinine 9.55 - 1.00 mg/dL 9.40   Sodium 864 - 854 mmol/L 138   Potassium 3.5 - 5.1 mmol/L 2.9   Chloride 98 - 111 mmol/L 103   CO2 22 -  32 mmol/L 20   Calcium  8.9 - 10.3 mg/dL 9.1   Total Protein 6.5 - 8.1 g/dL 8.0   Total Bilirubin 0.3 - 1.2 mg/dL 1.3   Alkaline Phos 38 - 126 U/L 91   AST 15 - 41 U/L 29   ALT 0 - 44 U/L 24    Edinburgh Score:    09/07/2023    8:30 AM  Edinburgh Postnatal Depression Scale Screening Tool  I have been able to laugh and see the funny side of things. 0  I have looked forward with enjoyment to things. 0  I have blamed myself unnecessarily when things went wrong. 1  I have been anxious or worried for no good reason. 2  I have felt scared or panicky for no good reason. 1  Things have been getting on top of me. 1  I have been  so unhappy that I have had difficulty sleeping. 0  I have felt sad or miserable. 0  I have been so unhappy that I have been crying. 0  The thought of harming myself has occurred to me. 0  Edinburgh Postnatal Depression Scale Total 5    Risk assessment for postpartum VTE and prophylactic treatment: Very high risk factors: None High risk factors: None Moderate risk factors: BMI 30-40 kg/m2  Postpartum VTE prophylaxis with LMWH not indicated  After visit meds:  Allergies as of 09/07/2023   No Known Allergies      Medication List     STOP taking these medications    IRON PO       Discharge home in stable condition Infant Feeding: Breast Infant Disposition:home with mother Discharge instruction: per After Visit Summary and Postpartum booklet. Activity: Advance as tolerated. Pelvic rest for 6 weeks.  Diet: routine diet Anticipated Birth Control:  non-hormonal Postpartum Appointment:6 weeks Additional Postpartum F/U:  none Future Appointments:No future appointments. Follow up Visit:  Follow-up Information     Vernel Therisa HERO, CNM. Schedule an appointment as soon as possible for a visit in 6 week(s).   Specialty: Certified Nurse Midwife Why: postpartum visit Contact information: [address] [place] [address] [phone]                 Plan:  Ahnika Hannibal was discharged to home in good condition. Follow-up appointment as directed.    Signed: Jenifer E Elianah Karis 09/07/2023 10:10 AM

## 2023-09-07 NOTE — Progress Notes (Signed)
AVS reviewed and given with pt. Pt understands. Pt discharged to go home.

## 2023-09-07 NOTE — Lactation Note (Signed)
 This note was copied from a baby's chart. Lactation Consultation Note  Patient Name: Lindsey Aguirre Date: 09/07/2023 Age:37 hours Reason for consult: Follow-up assessment   Maternal Data Does the patient have breastfeeding experience prior to this delivery?: No  Follow up assessment and discharge education with infant at 26 hours   Feeding Mother's Current Feeding Choice: Breast Milk and Formula  Did not observe feed  Discharge Discharge Education: Engorgement and breast care;Warning signs for feeding baby;Outpatient recommendation  Education on engorgement prevention/treatment was discussed as well as breastmilk storage guidelines.  LC provided patient with a handout on breastmilk storage guidelines from [facility]. [facility] [place] outpatient lactation services phone number written on the white board in the room.  Patient verbalized understanding   Consult Status Consult Status: Complete Date: 09/07/23 Follow-up type: Call as needed    Mindi Garibaldi 09/07/2023, 11:07 AM
